# Patient Record
Sex: Female | Born: 1970 | Race: Black or African American | Hispanic: No | Marital: Married | Smoking: Never smoker
Health system: Southern US, Community
[De-identification: ages and names within clinical notes are randomized; demographics above are authoritative.]

## PROBLEM LIST (undated history)

## (undated) ENCOUNTER — Inpatient Hospital Stay (HOSPITAL_COMMUNITY): Payer: Self-pay

## (undated) DIAGNOSIS — D249 Benign neoplasm of unspecified breast: Secondary | ICD-10-CM

## (undated) DIAGNOSIS — F419 Anxiety disorder, unspecified: Secondary | ICD-10-CM

## (undated) DIAGNOSIS — Z789 Other specified health status: Secondary | ICD-10-CM

## (undated) DIAGNOSIS — F329 Major depressive disorder, single episode, unspecified: Secondary | ICD-10-CM

## (undated) DIAGNOSIS — Z8619 Personal history of other infectious and parasitic diseases: Secondary | ICD-10-CM

## (undated) DIAGNOSIS — D219 Benign neoplasm of connective and other soft tissue, unspecified: Secondary | ICD-10-CM

## (undated) DIAGNOSIS — N390 Urinary tract infection, site not specified: Secondary | ICD-10-CM

## (undated) DIAGNOSIS — F32A Depression, unspecified: Secondary | ICD-10-CM

## (undated) DIAGNOSIS — K219 Gastro-esophageal reflux disease without esophagitis: Secondary | ICD-10-CM

## (undated) DIAGNOSIS — O09529 Supervision of elderly multigravida, unspecified trimester: Secondary | ICD-10-CM

## (undated) DIAGNOSIS — N6011 Diffuse cystic mastopathy of right breast: Secondary | ICD-10-CM

## (undated) HISTORY — PX: COLONOSCOPY: SHX174

## (undated) HISTORY — DX: Supervision of elderly multigravida, unspecified trimester: O09.529

## (undated) HISTORY — DX: Personal history of other infectious and parasitic diseases: Z86.19

## (undated) HISTORY — DX: Benign neoplasm of connective and other soft tissue, unspecified: D21.9

---

## 1998-04-13 ENCOUNTER — Other Ambulatory Visit: Admission: RE | Admit: 1998-04-13 | Discharge: 1998-04-13 | Payer: Self-pay | Admitting: Obstetrics & Gynecology

## 1999-11-30 ENCOUNTER — Other Ambulatory Visit: Admission: RE | Admit: 1999-11-30 | Discharge: 1999-11-30 | Payer: Self-pay | Admitting: Obstetrics & Gynecology

## 2003-09-02 ENCOUNTER — Other Ambulatory Visit: Admission: RE | Admit: 2003-09-02 | Discharge: 2003-09-02 | Payer: Self-pay | Admitting: Obstetrics & Gynecology

## 2005-03-14 ENCOUNTER — Other Ambulatory Visit: Admission: RE | Admit: 2005-03-14 | Discharge: 2005-03-14 | Payer: Self-pay | Admitting: Obstetrics & Gynecology

## 2009-05-16 ENCOUNTER — Encounter: Admission: RE | Admit: 2009-05-16 | Discharge: 2009-05-16 | Payer: Self-pay | Admitting: Family Medicine

## 2010-09-18 ENCOUNTER — Encounter: Payer: Self-pay | Admitting: Family Medicine

## 2011-03-21 ENCOUNTER — Other Ambulatory Visit (HOSPITAL_COMMUNITY): Payer: Self-pay | Admitting: Obstetrics & Gynecology

## 2011-03-21 DIAGNOSIS — N979 Female infertility, unspecified: Secondary | ICD-10-CM

## 2011-03-23 ENCOUNTER — Ambulatory Visit (HOSPITAL_COMMUNITY)
Admission: RE | Admit: 2011-03-23 | Discharge: 2011-03-23 | Disposition: A | Discharge status: Home / Self Care | Payer: BC Managed Care – PPO | Source: Ambulatory Visit | Attending: Obstetrics & Gynecology | Admitting: Obstetrics & Gynecology

## 2011-03-23 DIAGNOSIS — N979 Female infertility, unspecified: Secondary | ICD-10-CM | POA: Insufficient documentation

## 2011-03-23 MED ORDER — IOHEXOL 300 MG/ML  SOLN: 30.0000 mL | Freq: Once | INTRAMUSCULAR | Status: AC | PRN

## 2011-10-16 ENCOUNTER — Encounter (HOSPITAL_COMMUNITY): Payer: Self-pay | Admitting: Emergency Medicine

## 2011-10-16 ENCOUNTER — Emergency Department (HOSPITAL_COMMUNITY): Payer: BC Managed Care – PPO

## 2011-10-16 ENCOUNTER — Emergency Department (HOSPITAL_COMMUNITY)
Admission: EM | Admit: 2011-10-16 | Discharge: 2011-10-16 | Disposition: A | Discharge status: Home / Self Care | Payer: BC Managed Care – PPO | Attending: Emergency Medicine | Admitting: Emergency Medicine

## 2011-10-16 DIAGNOSIS — M25529 Pain in unspecified elbow: Secondary | ICD-10-CM | POA: Insufficient documentation

## 2011-10-16 DIAGNOSIS — M542 Cervicalgia: Secondary | ICD-10-CM | POA: Insufficient documentation

## 2011-10-16 DIAGNOSIS — M12869 Other specific arthropathies, not elsewhere classified, unspecified knee: Secondary | ICD-10-CM | POA: Insufficient documentation

## 2011-10-16 DIAGNOSIS — M545 Low back pain, unspecified: Secondary | ICD-10-CM | POA: Insufficient documentation

## 2011-10-16 DIAGNOSIS — Z79899 Other long term (current) drug therapy: Secondary | ICD-10-CM | POA: Insufficient documentation

## 2011-10-16 DIAGNOSIS — M25519 Pain in unspecified shoulder: Secondary | ICD-10-CM | POA: Insufficient documentation

## 2011-10-16 DIAGNOSIS — M7918 Myalgia, other site: Secondary | ICD-10-CM

## 2011-10-16 MED ORDER — HYDROCODONE-ACETAMINOPHEN 5-325 MG PO TABS
2.0000 | ORAL_TABLET | Freq: Once | ORAL | Status: AC
  Administered 2011-10-16: 2 via ORAL
  Filled 2011-10-16: qty 2

## 2011-10-16 MED ORDER — IBUPROFEN 800 MG PO TABS
800.0000 mg | ORAL_TABLET | Freq: Once | ORAL | Status: AC
  Administered 2011-10-16: 800 mg via ORAL
  Filled 2011-10-16: qty 1

## 2011-10-16 MED ORDER — IBUPROFEN 800 MG PO TABS: 800.0000 mg | ORAL_TABLET | Freq: Three times a day (TID) | ORAL | Status: AC | PRN

## 2011-10-16 MED ORDER — HYDROCODONE-ACETAMINOPHEN 5-325 MG PO TABS: 2.0000 | ORAL_TABLET | Freq: Four times a day (QID) | ORAL | Status: AC | PRN
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
## 2011-10-16 NOTE — ED Notes (Signed)
Patient transported to CT 

## 2011-10-16 NOTE — ED Notes (Signed)
Patient transported to X-ray 

## 2011-10-16 NOTE — ED Notes (Signed)
Family at bedside. 

## 2011-10-16 NOTE — ED Notes (Signed)
Vital signs stable. 

## 2011-10-16 NOTE — ED Notes (Signed)
NWG:[id number]<ZH> Expected date:10/16/11<BR> Expected time: 8:07 AM<BR> Means of arrival:Ambulance<BR> Comments:<BR> 41yo. weakness

## 2011-10-16 NOTE — ED Notes (Signed)
MD at bedside. 

## 2011-10-16 NOTE — ED Notes (Signed)
Patient is resting comfortably. 
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
## 2012-05-06 LAB — OB RESULTS CONSOLE ANTIBODY SCREEN: Antibody Screen: NEGATIVE

## 2012-05-06 LAB — OB RESULTS CONSOLE ABO/RH: RH Type: POSITIVE

## 2012-08-15 ENCOUNTER — Inpatient Hospital Stay (HOSPITAL_COMMUNITY)
Admission: AD | Admit: 2012-08-15 | Discharge: 2012-08-15 | Disposition: A | Discharge status: Home / Self Care | Payer: BC Managed Care – PPO | Source: Ambulatory Visit | Attending: Obstetrics and Gynecology | Admitting: Obstetrics and Gynecology

## 2012-08-15 ENCOUNTER — Encounter (HOSPITAL_COMMUNITY): Payer: Self-pay | Admitting: *Deleted

## 2012-08-15 ENCOUNTER — Encounter (HOSPITAL_COMMUNITY): Payer: Self-pay | Admitting: Family

## 2012-08-15 DIAGNOSIS — K5900 Constipation, unspecified: Secondary | ICD-10-CM | POA: Insufficient documentation

## 2012-08-15 DIAGNOSIS — O4700 False labor before 37 completed weeks of gestation, unspecified trimester: Secondary | ICD-10-CM

## 2012-08-15 DIAGNOSIS — R109 Unspecified abdominal pain: Secondary | ICD-10-CM | POA: Insufficient documentation

## 2012-08-15 DIAGNOSIS — O479 False labor, unspecified: Secondary | ICD-10-CM

## 2012-08-15 HISTORY — DX: Other specified health status: Z78.9

## 2012-08-15 HISTORY — DX: Urinary tract infection, site not specified: N39.0

## 2012-08-15 LAB — URINE MICROSCOPIC-ADD ON

## 2012-08-15 LAB — URINALYSIS, ROUTINE W REFLEX MICROSCOPIC
Bilirubin Urine: NEGATIVE
Glucose, UA: NEGATIVE mg/dL
Glucose, UA: NEGATIVE mg/dL
Hgb urine dipstick: NEGATIVE
Leukocytes, UA: NEGATIVE
Protein, ur: NEGATIVE mg/dL
Specific Gravity, Urine: >1.03 — ABNORMAL HIGH (ref 1.005–1.030)
Urobilinogen, UA: 0.2 mg/dL (ref 0.0–1.0)
Urobilinogen, UA: 0.2 mg/dL (ref 0.0–1.0)
pH: 6 (ref 5.0–8.0)

## 2012-08-15 LAB — FETAL FIBRONECTIN: Fetal Fibronectin: NEGATIVE

## 2012-08-15 LAB — WET PREP, GENITAL: Trich, Wet Prep: NONE SEEN

## 2012-08-15 LAB — GROUP B STREP BY PCR: Group B strep by PCR: NEGATIVE

## 2012-08-15 LAB — GC/CHLAMYDIA PROBE AMP: CT Probe RNA: NEGATIVE

## 2012-08-15 MED ORDER — LACTATED RINGERS IV SOLN
Freq: Once | INTRAVENOUS | Status: AC
  Administered 2012-08-15: 03:00:00 via INTRAVENOUS

## 2012-08-15 MED ORDER — TERBUTALINE SULFATE 1 MG/ML IJ SOLN
0.2500 mg | Freq: Once | INTRAMUSCULAR | Status: AC
  Administered 2012-08-15: 0.25 mg via SUBCUTANEOUS
  Filled 2012-08-15: qty 1

## 2012-08-15 MED ORDER — NIFEDIPINE 10 MG PO CAPS: 10.0000 mg | ORAL_CAPSULE | Freq: Four times a day (QID) | ORAL | Status: DC

## 2012-08-15 MED ORDER — NIFEDIPINE 10 MG PO CAPS
10.0000 mg | ORAL_CAPSULE | Freq: Once | ORAL | Status: AC
  Administered 2012-08-15: 10 mg via ORAL
  Filled 2012-08-15: qty 1

## 2012-08-15 NOTE — MAU Note (Signed)
Patient was seen here last night for contractions; given Procardia x 2 and sent home; reports contractions got worse when she got home last night and has not been able to sleep.  Feels she was contracting every 5 minutes today and called office; was told to come in for eval.

## 2012-08-15 NOTE — MAU Provider Note (Signed)
History     CSN: [id number]  Arrival date and time: 08/15/12 0120   First Provider Initiated Contact with Patient 08/15/12 [phone]      Chief Complaint  Patient presents with  . Contractions   HPI Ashley Gibson 41 y.o. 39 w 1 d with [facility] of March 20.  Comes to MAU with contractions.  Was seen in the office on Thursday and thought the abdominal pains were from constipation.  Pain has worsened since then and began being more intense since 6pm.    OB History    Grav Para Term Preterm Abortions TAB SAB Ect Mult Living   2 1 1  0 0 0 0 0 0 1      Past Medical History  Diagnosis Date  . No pertinent past medical history     Past Surgical History  Procedure Date  . No past surgeries     Family History  Problem Relation Age of Onset  . Hypertension Mother     History  Substance Use Topics  . Smoking status: Not on file  . Smokeless tobacco: Not on file  . Alcohol Use: No    Allergies: No Known Allergies  Prescriptions prior to admission  Medication Sig Dispense Refill  . Aspirin-Acetaminophen-Caffeine 260-130-16 MG TABS Take 1 packet by mouth every 6 (six) hours as needed. pain      . ibuprofen (ADVIL,MOTRIN) 200 MG tablet Take 400 mg by mouth every 6 (six) hours as needed. pain      . OVER THE COUNTER MEDICATION Take 1 capsule by mouth daily. Slim trim dietary supplement        Review of Systems  Constitutional: Negative for fever.  Gastrointestinal: Positive for abdominal pain. Negative for nausea, vomiting and diarrhea.  Genitourinary: Positive for frequency.       No vaginal discharge. No vaginal bleeding. No dysuria.   Physical Exam   Blood pressure 141/88, pulse 90, temperature 98.8 F (37.1 C), temperature source Oral, resp. rate 18.  Physical Exam  Nursing note and vitals reviewed. Constitutional: She is oriented to person, place, and time. She appears well-developed and well-nourished.  HENT:  Head: Normocephalic.  Eyes: EOM are normal.  Neck:  Neck supple.  GI: Soft. There is no tenderness. There is no rebound and no guarding.       Contractions 2-4 minutes on arrival to MAU.  Uncomfortable with contractions. FHT baseline 145  Genitourinary:       Speculum exam: Vagina - some erythema, Small amount of creamy discharge, no odor Cervix - No contact bleeding Bimanual exam: Cervix posterior, closed, thick, soft, presenting part high GC/Chlam, wet prep done Chaperone present for exam.  Musculoskeletal: Normal range of motion.  Neurological: She is alert and oriented to person, place, and time.  Skin: Skin is warm and dry.  Psychiatric: She has a normal mood and affect.    MAU Course  Procedures Results for orders placed during the hospital encounter of 08/15/12 (from the past 24 hour(s))  URINALYSIS, ROUTINE W REFLEX MICROSCOPIC     Status: Abnormal   Collection Time   08/15/12  1:30 AM      Component Value Range   Color, Urine YELLOW  YELLOW   APPearance CLEAR  CLEAR   Specific Gravity, Urine >1.030 (*) 1.005 - 1.030   pH 6.0  5.0 - 8.0   Glucose, UA NEGATIVE  NEGATIVE mg/dL   Hgb urine dipstick NEGATIVE  NEGATIVE   Bilirubin Urine NEGATIVE  NEGATIVE  Ketones, ur >80 (*) NEGATIVE mg/dL   Protein, ur NEGATIVE  NEGATIVE mg/dL   Urobilinogen, UA 0.2  0.0 - 1.0 mg/dL   Nitrite NEGATIVE  NEGATIVE   Leukocytes, UA TRACE (*) NEGATIVE  URINE MICROSCOPIC-ADD ON     Status: Abnormal   Collection Time   08/15/12  1:30 AM      Component Value Range   Squamous Epithelial / LPF FEW (*) RARE   WBC, UA 0-2  <3 WBC/hpf   RBC / HPF 0-2  <3 RBC/hpf   Bacteria, UA RARE  RARE   Urine-Other MUCOUS PRESENT    WET PREP, GENITAL     Status: Abnormal   Collection Time   08/15/12  2:30 AM      Component Value Range   Yeast Wet Prep HPF POC NONE SEEN  NONE SEEN   Trich, Wet Prep NONE SEEN  NONE SEEN   Clue Cells Wet Prep HPF POC NONE SEEN  NONE SEEN   WBC, Wet Prep HPF POC FEW (*) NONE SEEN  FETAL FIBRONECTIN     Status: Normal    Collection Time   08/15/12  2:30 AM      Component Value Range   Fetal Fibronectin NEGATIVE  NEGATIVE   MDM 0245  Consult with Dr. Henderson Cloud re: plan of care.  Will give Procardia PO and fluids.  Assessment and Plan  Preterm contractions  Plan Procardia 10 mg PO 2 doses given to decrease contractions. 1000 cc LR infused for hydration. Contractions have decreased dramatically since arrival with fluids and procardia. Will discharge client.  To call the office today if contractions worsen after going home. Drink at least 8 8-oz glasses of water every day.   Ashley Gibson 08/15/2012, 2:28 AM

## 2012-08-15 NOTE — MAU Note (Signed)
Pt states she was seen in the office at 0845. Pt states she informed provider that she was having pains at that time. Pt states pains started getting worse at about 1800.

## 2012-08-15 NOTE — Progress Notes (Signed)
Geannie Risen NP informed of pt's presence on the unit and contraction pattern

## 2012-08-15 NOTE — Progress Notes (Signed)
Pt uncomfortable during pelvic exam.

## 2012-08-15 NOTE — MAU Provider Note (Signed)
History     CSN: [id number]  Arrival date and time: 08/15/12 1351   None     Chief Complaint  Patient presents with  . Laboring   HPI 41 y.o. G2P1001 at [redacted]w[redacted]d with contractions. Seen in MAU last night. Had two doses procardia in MAU with decrease in contractions. Started again 6:30 AM, every 5 minutes. No LOF or vaginal bleeding. Not able to sleep. Increased urinary frequency.  Baby moving normally.    OB History    Grav Para Term Preterm Abortions TAB SAB Ect Mult Living   2 1 1  0 0 0 0 0 0 1      Past Medical History  Diagnosis Date  . No pertinent past medical history   . UTI (lower urinary tract infection)     Past Surgical History  Procedure Date  . No past surgeries     Family History  Problem Relation Age of Onset  . Hypertension Mother     History  Substance Use Topics  . Smoking status: Never Smoker   . Smokeless tobacco: Not on file  . Alcohol Use: No    Allergies: No Known Allergies  Prescriptions prior to admission  Medication Sig Dispense Refill  . acetaminophen (TYLENOL) 500 MG tablet Take 1,000 mg by mouth daily as needed. pain      . calcium carbonate (TUMS - DOSED IN MG ELEMENTAL CALCIUM) 500 MG chewable tablet Chew 1 tablet by mouth as needed. indigestion      . Multiple Vitamin (MULTIVITAMIN WITH MINERALS) TABS Take 1 tablet by mouth daily.      . ondansetron (ZOFRAN-ODT) 8 MG disintegrating tablet Take 8 mg by mouth every 12 (twelve) hours as needed.        Review of Systems  Constitutional: Negative for fever and chills.  Eyes: Negative for blurred vision and double vision.  Gastrointestinal: Positive for constipation. Negative for nausea, vomiting and diarrhea.  Genitourinary: Positive for frequency. Negative for dysuria.  Neurological: Positive for headaches.   Physical Exam   Blood pressure 128/71, pulse 98, temperature 97.9 F (36.6 C), temperature source Oral, resp. rate 20, height 5\' 3"  (1.6 m), weight 104.441 kg (230 lb 4  oz), last menstrual period 09/28/2011.  Physical Exam  Constitutional: She is oriented to person, place, and time. She appears well-developed and well-nourished. No distress.  HENT:  Head: Normocephalic and atraumatic.  Eyes: Conjunctivae normal and EOM are normal.  Neck: Normal range of motion. Neck supple.  Cardiovascular: Normal rate.   Respiratory: Effort normal. No respiratory distress.  GI: Soft. Bowel sounds are normal. There is tenderness (right side).  Neurological: She is alert and oriented to person, place, and time.  Skin: Skin is warm and dry.  Psychiatric: She has a normal mood and affect.   FHTs:  135-140, moderate variability, accels present, occasional short variable. TOCO: Poor tracing, not picking up contractions when patient is breathing through them. Only 2 ctx in 1 hour registered after terbutaline.   MAU Course  Procedures  0.25 mg Terbutaline given SQ. Pt states ctx are still there but spaced out to q 15 minutes.    Assessment and Plan  [redacted]w[redacted]d G2P1001 at [redacted]w[redacted]d with preterm contractions. - No cervical change - Improved with terbutaline (and procardia last night) - Hx preterm ctx with subsequent term delivery - Discussed with Dr. Dareen Piano:  Home on procardia 10 mg q 6 hours and f/u as scheduled - Labor precautions discussed.   Napoleon Form 08/15/2012, 3:44 PM

## 2012-08-15 NOTE — MAU Note (Signed)
Pt states was seen here last pm, given iv for dehydration, and given procardia. Contractions still coming. Denies bleeding or lof.

## 2012-08-27 NOTE — L&D Delivery Note (Signed)
Delivery Note At 4:23 PM a viable and healthy female was delivered via Vaginal, Spontaneous Delivery (Presentation: Left; Occiput Anterior).  APGAR: 8, 9; weight pending.   Placenta status: spontaneous, intact.  Cord: 3 vessels with a loose nuchal x 1  Anesthesia: Epidural and 1% lidocaine Episiotomy: None Lacerations: 2nd degree perineal Suture Repair: 3.0 vicryl rapide Est. Blood Loss (mL):  250 cc  Mom to postpartum.  Baby to nursery-stable.  Summar Mcglothlin H. 11/08/2012, 4:49 PM

## 2012-10-02 LAB — OB RESULTS CONSOLE GBS: GBS: NEGATIVE

## 2012-10-24 ENCOUNTER — Telehealth (HOSPITAL_COMMUNITY): Payer: Self-pay | Admitting: *Deleted

## 2012-10-24 ENCOUNTER — Encounter (HOSPITAL_COMMUNITY): Payer: Self-pay | Admitting: *Deleted

## 2012-10-24 NOTE — Telephone Encounter (Signed)
Preadmission screen  

## 2012-11-02 ENCOUNTER — Inpatient Hospital Stay (HOSPITAL_COMMUNITY)
Admission: AD | Admit: 2012-11-02 | Discharge: 2012-11-03 | Disposition: A | Discharge status: Home / Self Care | Payer: BC Managed Care – PPO | Source: Ambulatory Visit | Attending: Obstetrics and Gynecology | Admitting: Obstetrics and Gynecology

## 2012-11-02 ENCOUNTER — Encounter (HOSPITAL_COMMUNITY): Payer: Self-pay | Admitting: Family

## 2012-11-02 ENCOUNTER — Inpatient Hospital Stay (HOSPITAL_COMMUNITY): Payer: BC Managed Care – PPO

## 2012-11-02 DIAGNOSIS — O368190 Decreased fetal movements, unspecified trimester, not applicable or unspecified: Secondary | ICD-10-CM | POA: Insufficient documentation

## 2012-11-02 LAB — CBC WITH DIFFERENTIAL/PLATELET
Basophils Absolute: 0 10*3/uL (ref 0.0–0.1)
Eosinophils Absolute: 0.1 10*3/uL (ref 0.0–0.7)
Lymphocytes Relative: 25 % (ref 12–46)
Lymphs Abs: 2.1 10*3/uL (ref 0.7–4.0)
MCH: 25.6 pg — ABNORMAL LOW (ref 26.0–34.0)
Neutrophils Relative %: 66 % (ref 43–77)
Platelets: 234 10*3/uL (ref 150–400)
RBC: 3.83 MIL/uL — ABNORMAL LOW (ref 3.87–5.11)
RDW: 15.3 % (ref 11.5–15.5)
WBC: 8.6 10*3/uL (ref 4.0–10.5)

## 2012-11-02 LAB — COMPREHENSIVE METABOLIC PANEL
ALT: 9 U/L (ref 0–35)
AST: 14 U/L (ref 0–37)
Alkaline Phosphatase: 122 U/L — ABNORMAL HIGH (ref 39–117)
GFR calc Af Amer: 90 mL/min — ABNORMAL LOW (ref >90)
Glucose, Bld: 83 mg/dL (ref 70–99)
Potassium: 4.2 mEq/L (ref 3.5–5.1)
Sodium: 136 mEq/L (ref 135–145)
Total Protein: 5.9 g/dL — ABNORMAL LOW (ref 6.0–8.3)

## 2012-11-02 LAB — URINE MICROSCOPIC-ADD ON

## 2012-11-02 LAB — URINALYSIS, ROUTINE W REFLEX MICROSCOPIC
Protein, ur: NEGATIVE mg/dL
Urobilinogen, UA: 0.2 mg/dL (ref 0.0–1.0)

## 2012-11-02 NOTE — MAU Note (Signed)
Pt reports baby is not moving as much as normal. States she has increased fluid and they told her to monitor fetal movement.

## 2012-11-02 NOTE — MAU Provider Note (Signed)
History     CSN: [id number]  Arrival date and time: 11/02/12 2123   First Provider Initiated Contact with Patient 11/02/12 2200      Chief Complaint  Patient presents with  . Decreased Fetal Movement   HPI Ashley Gibson is a 42 y.o. female who presents to MAU with decreased fetal movement. She states that she has felt much less movement all day than usual so decided to come in tonight. She was in the office 5 days ago and told that if she has any decrease in fetal movement to come to MAU for monitoring. The patient states that she has to much fluid around the baby. She denies bleeding or leaking of fluid. The history was provided by the patient.   OB History   Grav Para Term Preterm Abortions TAB SAB Ect Mult Living   2 1 1  0 0 0 0 0 0 1      Past Medical History  Diagnosis Date  . No pertinent past medical history   . UTI (lower urinary tract infection)   . Hx of chlamydia infection   . Hx of varicella   . AMA (advanced maternal age) multigravida 35+   . Fibroid     Past Surgical History  Procedure Laterality Date  . No past surgeries      Family History  Problem Relation Age of Onset  . Hypertension Mother   . Asthma Son     History  Substance Use Topics  . Smoking status: Never Smoker   . Smokeless tobacco: Not on file  . Alcohol Use: No    Allergies: No Known Allergies  No prescriptions prior to admission    Review of Systems  Constitutional: Negative for fever and chills.  HENT: Negative for neck pain.   Eyes: Negative for blurred vision.  Respiratory: Negative for cough.   Cardiovascular: Negative for chest pain.  Gastrointestinal: Negative for vomiting. Abdominal pain: occasional contraction.  Genitourinary: Negative for frequency.       Decreased fetal movement. No vaginal bleeding or leaking of fluid.  Musculoskeletal: Positive for back pain.  Neurological: Negative for dizziness and headaches.  Psychiatric/Behavioral: Negative for  depression.   Blood pressure 133/67, pulse 79, temperature 98.9 F (37.2 C), temperature source Oral, resp. rate 20, height 5\' 3"  (1.6 m), weight 238 lb (107.956 kg), last menstrual period 09/28/2011, SpO2 100.00%.  Physical Exam  Nursing note and vitals reviewed. Constitutional: She is oriented to person, place, and time. She appears well-developed and well-nourished. No distress.  Blood pressure 153/74  HENT:  Head: Normocephalic and atraumatic.  Eyes: EOM are normal.  Neck: Neck supple.  Cardiovascular: Normal rate.   Respiratory: Effort normal.  GI: Soft. There is no tenderness.  Musculoskeletal: Normal range of motion.  Neurological: She is alert and oriented to person, place, and time.  Skin: Skin is warm and dry.  Psychiatric: She has a normal mood and affect. Her behavior is normal. Judgment and thought content normal.   Procedures  Results for orders placed during the hospital encounter of 11/02/12 (from the past 24 hour(s))  URINALYSIS, ROUTINE W REFLEX MICROSCOPIC     Status: Abnormal   Collection Time    11/02/12  9:35 PM      Result Value Range   Color, Urine YELLOW  YELLOW   APPearance CLEAR  CLEAR   Specific Gravity, Urine 1.025  1.005 - 1.030   pH 6.0  5.0 - 8.0   Glucose, UA NEGATIVE  NEGATIVE mg/dL   Hgb urine dipstick LARGE (*) NEGATIVE   Bilirubin Urine NEGATIVE  NEGATIVE   Ketones, ur NEGATIVE  NEGATIVE mg/dL   Protein, ur NEGATIVE  NEGATIVE mg/dL   Urobilinogen, UA 0.2  0.0 - 1.0 mg/dL   Nitrite NEGATIVE  NEGATIVE   Leukocytes, UA SMALL (*) NEGATIVE  URINE MICROSCOPIC-ADD ON     Status: Abnormal   Collection Time    11/02/12  9:35 PM      Result Value Range   Squamous Epithelial / LPF MANY (*) RARE   WBC, UA 7-10  <3 WBC/hpf   RBC / HPF 11-20  <3 RBC/hpf   Bacteria, UA MANY (*) RARE  CBC WITH DIFFERENTIAL     Status: Abnormal   Collection Time    11/02/12 11:25 PM      Result Value Range   WBC 8.6  4.0 - 10.5 K/uL   RBC 3.83 (*) 3.87 - 5.11  MIL/uL   Hemoglobin 9.8 (*) 12.0 - 15.0 g/dL   HCT 16.1 (*) 09.6 - 04.5 %   MCV 80.2  78.0 - 100.0 fL   MCH 25.6 (*) 26.0 - 34.0 pg   MCHC 31.9  30.0 - 36.0 g/dL   RDW 40.9  81.1 - 91.4 %   Platelets 234  150 - 400 K/uL   Neutrophils Relative 66  43 - 77 %   Neutro Abs 5.7  1.7 - 7.7 K/uL   Lymphocytes Relative 25  12 - 46 %   Lymphs Abs 2.1  0.7 - 4.0 K/uL   Monocytes Relative 8  3 - 12 %   Monocytes Absolute 0.7  0.1 - 1.0 K/uL   Eosinophils Relative 1  0 - 5 %   Eosinophils Absolute 0.1  0.0 - 0.7 K/uL   Basophils Relative 0  0 - 1 %   Basophils Absolute 0.0  0.0 - 0.1 K/uL  COMPREHENSIVE METABOLIC PANEL     Status: Abnormal   Collection Time    11/02/12 11:25 PM      Result Value Range   Sodium 136  135 - 145 mEq/L   Potassium 4.2  3.5 - 5.1 mEq/L   Chloride 105  96 - 112 mEq/L   CO2 23  19 - 32 mEq/L   Glucose, Bld 83  70 - 99 mg/dL   BUN 12  6 - 23 mg/dL   Creatinine, Ser 7.82  0.50 - 1.10 mg/dL   Calcium 9.5  8.4 - 95.6 mg/dL   Total Protein 5.9 (*) 6.0 - 8.3 g/dL   Albumin 2.5 (*) 3.5 - 5.2 g/dL   AST 14  0 - 37 U/L   ALT 9  0 - 35 U/L   Alkaline Phosphatase 122 (*) 39 - 117 U/L   Total Bilirubin 0.3  0.3 - 1.2 mg/dL   GFR calc non Af Amer 77 (*) >90 mL/min   GFR calc Af Amer 90 (*) >90 mL/min    EFM: Baseline 130, reactive,   US Ob Limited  11/03/2012  *RADIOLOGY REPORT*  Clinical Data:  Pregnant, decreased fetal movement  LIMITED OBSTETRIC ULTRASOUND  BIOPHYSICAL PROFILE  Number of Fetuses: 1 Heart Rate:  146bpm Movement:  Present Presentation:  Cephalic Placental Location:  Anterior fundal Previa:  No Amniotic Fluid (subjective):  Increased  Vertical pocket:  11.1cm      AFI:  27.9cm (5%ile 7.3 cm, 95%ile 23.9 cm)  BPD:  9.19cm      37w  3d          EDC: Is not 14  MATERNAL FINDINGS: Cervix:  Not evaluated Uterus/Adnexae:  Right ovary not visualized.  Left ovary unremarkable.  Uterine leiomyomata identified, largest left fundal 8.6 x 7.8 x 10.1 cm  BPP:  Movement:  2 Breathing:  2 Tone:  2 Amniotic Fluid:  2  Total Score:  8/8  Impression: Single live intrauterine gestation as above. Subjectively increased amniotic fluid volume with increased AFI of 27.9 cm. Uterine leiomyomata. BPP 8/8.  This exam is performed on an emergent basis and does not comprehensively evaluate fetal size, dating, or anatomy, and a follow-up complete OB US should be considered if further fetal assessment is warranted.   Original Report Authenticated By: Ulyses Southward, M.D.    US Fetal Bpp W/o Non Stress  11/03/2012  *RADIOLOGY REPORT*  Clinical Data:  Pregnant, decreased fetal movement  LIMITED OBSTETRIC ULTRASOUND  BIOPHYSICAL PROFILE  Number of Fetuses: 1 Heart Rate:  146bpm Movement:  Present Presentation:  Cephalic Placental Location:  Anterior fundal Previa:  No Amniotic Fluid (subjective):  Increased  Vertical pocket:  11.1cm      AFI:  27.9cm (5%ile 7.3 cm, 95%ile 23.9 cm)  BPD:  9.19cm      37w         3d          EDC: Is not 14  MATERNAL FINDINGS: Cervix:  Not evaluated Uterus/Adnexae:  Right ovary not visualized.  Left ovary unremarkable.  Uterine leiomyomata identified, largest left fundal 8.6 x 7.8 x 10.1 cm  BPP: Movement:  2 Breathing:  2 Tone:  2 Amniotic Fluid:  2  Total Score:  8/8  Impression: Single live intrauterine gestation as above. Subjectively increased amniotic fluid volume with increased AFI of 27.9 cm. Uterine leiomyomata. BPP 8/8.  This exam is performed on an emergent basis and does not comprehensively evaluate fetal size, dating, or anatomy, and a follow-up complete OB US should be considered if further fetal assessment is warranted.   Original Report Authenticated By: Ulyses Southward, M.D.     23:15 Discussed with Dr. Claiborne Billings Will do serial blood pressures, Ultrasound, CBC, CMET and RN will call her with the results.  Results called to Dr. Claiborne Billings and discharge instructions given.   Assessment: 42 y.o. female @ [redacted]w[redacted]d gestation reporting decreased fetal  movement  Plan:  Increase PO fluids, follow up in the office  NEESE,HOPE, RN, FNP, [facility] 11/03/2012, 5:00 AM

## 2012-11-02 NOTE — MAU Note (Addendum)
Patient presents to MAU with c/o decreased fetal movement today; reports she was told to come in if fetal movement decreased. Patient reports she was told Tuesday she has increased amniotic fluid.

## 2012-11-04 LAB — URINE CULTURE
Colony Count: NO GROWTH
Culture: NO GROWTH

## 2012-11-08 ENCOUNTER — Inpatient Hospital Stay (HOSPITAL_COMMUNITY): Payer: BC Managed Care – PPO | Admitting: Anesthesiology

## 2012-11-08 ENCOUNTER — Encounter (HOSPITAL_COMMUNITY): Payer: Self-pay | Admitting: Anesthesiology

## 2012-11-08 ENCOUNTER — Inpatient Hospital Stay (HOSPITAL_COMMUNITY)
Admission: AD | Admit: 2012-11-08 | Discharge: 2012-11-10 | DRG: 373 | Disposition: A | Discharge status: Home / Self Care | Payer: BC Managed Care – PPO | Source: Ambulatory Visit | Attending: Obstetrics and Gynecology | Admitting: Obstetrics and Gynecology

## 2012-11-08 ENCOUNTER — Encounter (HOSPITAL_COMMUNITY): Payer: Self-pay | Admitting: *Deleted

## 2012-11-08 DIAGNOSIS — O09529 Supervision of elderly multigravida, unspecified trimester: Secondary | ICD-10-CM | POA: Diagnosis present

## 2012-11-08 DIAGNOSIS — O409XX0 Polyhydramnios, unspecified trimester, not applicable or unspecified: Secondary | ICD-10-CM | POA: Diagnosis present

## 2012-11-08 DIAGNOSIS — D259 Leiomyoma of uterus, unspecified: Secondary | ICD-10-CM | POA: Diagnosis present

## 2012-11-08 DIAGNOSIS — D4959 Neoplasm of unspecified behavior of other genitourinary organ: Secondary | ICD-10-CM | POA: Diagnosis present

## 2012-11-08 DIAGNOSIS — O34599 Maternal care for other abnormalities of gravid uterus, unspecified trimester: Secondary | ICD-10-CM | POA: Diagnosis present

## 2012-11-08 LAB — CBC
HCT: 31.1 % — ABNORMAL LOW (ref 36.0–46.0)
HCT: 34 % — ABNORMAL LOW (ref 36.0–46.0)
Hemoglobin: 10 g/dL — ABNORMAL LOW (ref 12.0–15.0)
Hemoglobin: 10.9 g/dL — ABNORMAL LOW (ref 12.0–15.0)
MCH: 25.5 pg — ABNORMAL LOW (ref 26.0–34.0)
MCHC: 32.1 g/dL (ref 30.0–36.0)
MCV: 78.9 fL (ref 78.0–100.0)
MCV: 79.4 fL (ref 78.0–100.0)
Platelets: 223 10*3/uL (ref 150–400)
RBC: 3.94 MIL/uL (ref 3.87–5.11)
RBC: 4.28 MIL/uL (ref 3.87–5.11)
RDW: 15.6 % — ABNORMAL HIGH (ref 11.5–15.5)
WBC: 8 10*3/uL (ref 4.0–10.5)
WBC: 15 10*3/uL — ABNORMAL HIGH (ref 4.0–10.5)

## 2012-11-08 LAB — COMPREHENSIVE METABOLIC PANEL
ALT: 8 U/L (ref 0–35)
Alkaline Phosphatase: 131 U/L — ABNORMAL HIGH (ref 39–117)
CO2: 20 mEq/L (ref 19–32)
Chloride: 105 mEq/L (ref 96–112)
GFR calc Af Amer: >90 mL/min (ref >90)
Glucose, Bld: 82 mg/dL (ref 70–99)
Potassium: 3.7 mEq/L (ref 3.5–5.1)
Sodium: 136 mEq/L (ref 135–145)
Total Bilirubin: 0.3 mg/dL (ref 0.3–1.2)
Total Protein: 6.3 g/dL (ref 6.0–8.3)

## 2012-11-08 LAB — ABO/RH: ABO/RH(D): B POS

## 2012-11-08 MED ORDER — BENZOCAINE-MENTHOL 20-0.5 % EX AERO
1.0000 "application " | INHALATION_SPRAY | CUTANEOUS | Status: DC | PRN
  Administered 2012-11-08: 1 via TOPICAL
  Filled 2012-11-08 (×2): qty 56

## 2012-11-08 MED ORDER — PHENYLEPHRINE 40 MCG/ML (10ML) SYRINGE FOR IV PUSH (FOR BLOOD PRESSURE SUPPORT): 80.0000 ug | PREFILLED_SYRINGE | INTRAVENOUS | Status: DC | PRN

## 2012-11-08 MED ORDER — DIBUCAINE 1 % RE OINT: 1.0000 "application " | TOPICAL_OINTMENT | RECTAL | Status: DC | PRN

## 2012-11-08 MED ORDER — SENNOSIDES-DOCUSATE SODIUM 8.6-50 MG PO TABS
2.0000 | ORAL_TABLET | Freq: Every day | ORAL | Status: DC
  Administered 2012-11-08 – 2012-11-09 (×2): 2 via ORAL

## 2012-11-08 MED ORDER — FENTANYL 2.5 MCG/ML BUPIVACAINE 1/10 % EPIDURAL INFUSION (WH - ANES)
14.0000 mL/h | INTRAMUSCULAR | Status: DC | PRN
  Administered 2012-11-08: 14 mL/h via EPIDURAL
  Filled 2012-11-08: qty 125

## 2012-11-08 MED ORDER — OXYTOCIN BOLUS FROM INFUSION: 500.0000 mL | INTRAVENOUS | Status: DC

## 2012-11-08 MED ORDER — OXYTOCIN 40 UNITS IN LACTATED RINGERS INFUSION - SIMPLE MED
1.0000 m[IU]/min | INTRAVENOUS | Status: DC
  Administered 2012-11-08: 2 m[IU]/min via INTRAVENOUS

## 2012-11-08 MED ORDER — DIPHENHYDRAMINE HCL 25 MG PO CAPS: 25.0000 mg | ORAL_CAPSULE | Freq: Four times a day (QID) | ORAL | Status: DC | PRN

## 2012-11-08 MED ORDER — FLEET ENEMA 7-19 GM/118ML RE ENEM: 1.0000 | ENEMA | RECTAL | Status: DC | PRN

## 2012-11-08 MED ORDER — ZOLPIDEM TARTRATE 5 MG PO TABS: 5.0000 mg | ORAL_TABLET | Freq: Every evening | ORAL | Status: DC | PRN

## 2012-11-08 MED ORDER — IBUPROFEN 600 MG PO TABS
600.0000 mg | ORAL_TABLET | Freq: Four times a day (QID) | ORAL | Status: DC
  Administered 2012-11-08 – 2012-11-10 (×7): 600 mg via ORAL
  Filled 2012-11-08 (×8): qty 1

## 2012-11-08 MED ORDER — ACETAMINOPHEN 325 MG PO TABS: 650.0000 mg | ORAL_TABLET | ORAL | Status: DC | PRN

## 2012-11-08 MED ORDER — TETANUS-DIPHTH-ACELL PERTUSSIS 5-2.5-18.5 LF-MCG/0.5 IM SUSP
0.5000 mL | Freq: Once | INTRAMUSCULAR | Status: AC
  Administered 2012-11-09: 0.5 mL via INTRAMUSCULAR
  Filled 2012-11-08: qty 0.5

## 2012-11-08 MED ORDER — BUTORPHANOL TARTRATE 1 MG/ML IJ SOLN
1.0000 mg | INTRAMUSCULAR | Status: DC | PRN
  Administered 2012-11-08: 1 mg via INTRAVENOUS
  Filled 2012-11-08: qty 1

## 2012-11-08 MED ORDER — WITCH HAZEL-GLYCERIN EX PADS: 1.0000 "application " | MEDICATED_PAD | CUTANEOUS | Status: DC | PRN

## 2012-11-08 MED ORDER — OXYCODONE-ACETAMINOPHEN 5-325 MG PO TABS: 1.0000 | ORAL_TABLET | ORAL | Status: DC | PRN

## 2012-11-08 MED ORDER — PRENATAL MULTIVITAMIN CH
1.0000 | ORAL_TABLET | Freq: Every day | ORAL | Status: DC
  Filled 2012-11-08: qty 1

## 2012-11-08 MED ORDER — PHENYLEPHRINE 40 MCG/ML (10ML) SYRINGE FOR IV PUSH (FOR BLOOD PRESSURE SUPPORT)
80.0000 ug | PREFILLED_SYRINGE | INTRAVENOUS | Status: DC | PRN
  Filled 2012-11-08: qty 5

## 2012-11-08 MED ORDER — DIPHENHYDRAMINE HCL 50 MG/ML IJ SOLN: 12.5000 mg | INTRAMUSCULAR | Status: DC | PRN

## 2012-11-08 MED ORDER — EPHEDRINE 5 MG/ML INJ
10.0000 mg | INTRAVENOUS | Status: DC | PRN
  Filled 2012-11-08: qty 4

## 2012-11-08 MED ORDER — LANOLIN HYDROUS EX OINT: TOPICAL_OINTMENT | CUTANEOUS | Status: DC | PRN

## 2012-11-08 MED ORDER — IBUPROFEN 600 MG PO TABS: 600.0000 mg | ORAL_TABLET | Freq: Four times a day (QID) | ORAL | Status: DC | PRN

## 2012-11-08 MED ORDER — SIMETHICONE 80 MG PO CHEW: 80.0000 mg | CHEWABLE_TABLET | ORAL | Status: DC | PRN

## 2012-11-08 MED ORDER — LACTATED RINGERS IV SOLN
500.0000 mL | Freq: Once | INTRAVENOUS | Status: AC
  Administered 2012-11-08: 1000 mL via INTRAVENOUS

## 2012-11-08 MED ORDER — ONDANSETRON HCL 4 MG/2ML IJ SOLN: 4.0000 mg | INTRAMUSCULAR | Status: DC | PRN

## 2012-11-08 MED ORDER — LIDOCAINE HCL (PF) 1 % IJ SOLN
INTRAMUSCULAR | Status: DC | PRN
  Administered 2012-11-08 (×2): 5 mL

## 2012-11-08 MED ORDER — ONDANSETRON HCL 4 MG PO TABS: 4.0000 mg | ORAL_TABLET | ORAL | Status: DC | PRN

## 2012-11-08 MED ORDER — LIDOCAINE HCL (PF) 1 % IJ SOLN
30.0000 mL | INTRAMUSCULAR | Status: AC | PRN
  Administered 2012-11-08: 30 mL via SUBCUTANEOUS
  Filled 2012-11-08 (×2): qty 30

## 2012-11-08 MED ORDER — TERBUTALINE SULFATE 1 MG/ML IJ SOLN: 0.2500 mg | Freq: Once | INTRAMUSCULAR | Status: DC | PRN

## 2012-11-08 MED ORDER — EPHEDRINE 5 MG/ML INJ: 10.0000 mg | INTRAVENOUS | Status: DC | PRN

## 2012-11-08 MED ORDER — LACTATED RINGERS IV SOLN: 500.0000 mL | INTRAVENOUS | Status: DC | PRN

## 2012-11-08 MED ORDER — CITRIC ACID-SODIUM CITRATE 334-500 MG/5ML PO SOLN: 30.0000 mL | ORAL | Status: DC | PRN

## 2012-11-08 MED ORDER — LACTATED RINGERS IV SOLN
INTRAVENOUS | Status: DC
  Administered 2012-11-08 (×2): via INTRAVENOUS

## 2012-11-08 MED ORDER — ONDANSETRON HCL 4 MG/2ML IJ SOLN: 4.0000 mg | Freq: Four times a day (QID) | INTRAMUSCULAR | Status: DC | PRN

## 2012-11-08 MED ORDER — OXYTOCIN 40 UNITS IN LACTATED RINGERS INFUSION - SIMPLE MED
62.5000 mL/h | INTRAVENOUS | Status: DC
  Filled 2012-11-08: qty 1000

## 2012-11-08 NOTE — Anesthesia Preprocedure Evaluation (Signed)
Anesthesia Evaluation  Patient identified by MRN, date of birth, ID band Patient awake    Reviewed: Allergy & Precautions, H&P , Patient's Chart, lab work & pertinent test results  Airway Mallampati: III TM Distance: >3 FB Neck ROM: full    Dental no notable dental hx.    Pulmonary neg pulmonary ROS,  breath sounds clear to auscultation  Pulmonary exam normal       Cardiovascular negative cardio ROS  Rhythm:regular Rate:Normal     Neuro/Psych negative neurological ROS  negative psych ROS   GI/Hepatic negative GI ROS, Neg liver ROS,   Endo/Other  negative endocrine ROSMorbid obesity  Renal/GU negative Renal ROS     Musculoskeletal   Abdominal   Peds  Hematology negative hematology ROS (+)   Anesthesia Other Findings No pertinent past medical history     UTI (lower urinary tract infection)        Hx of chlamydia infection     Hx of varicella        AMA (advanced maternal age) multigravida 35+     Fibroid    Reproductive/Obstetrics (+) Pregnancy                           Anesthesia Physical Anesthesia Plan  ASA: III  Anesthesia Plan: Epidural   Post-op Pain Management:    Induction:   Airway Management Planned:   Additional Equipment:   Intra-op Plan:   Post-operative Plan:   Informed Consent: I have reviewed the patients History and Physical, chart, labs and discussed the procedure including the risks, benefits and alternatives for the proposed anesthesia with the patient or authorized representative who has indicated his/her understanding and acceptance.     Plan Discussed with:   Anesthesia Plan Comments:         Anesthesia Quick Evaluation

## 2012-11-08 NOTE — Anesthesia Procedure Notes (Signed)
Epidural Patient location during procedure: OB Start time: 11/08/2012 12:20 PM  Staffing Anesthesiologist: Angus Seller., Harrell Gave. Performed by: anesthesiologist   Preanesthetic Checklist Completed: patient identified, site marked, surgical consent, pre-op evaluation, timeout performed, IV checked, risks and benefits discussed and monitors and equipment checked  Epidural Patient position: sitting Prep: site prepped and draped and DuraPrep Patient monitoring: continuous pulse ox and blood pressure Approach: midline Injection technique: LOR air and LOR saline  Needle:  Needle type: Tuohy  Needle gauge: 17 G Needle length: 9 cm and 9 Needle insertion depth: 7 cm Catheter type: closed end flexible Catheter size: 19 Gauge Catheter at skin depth: 15 cm Test dose: negative  Assessment Events: blood not aspirated, injection not painful, no injection resistance, negative IV test and no paresthesia  Additional Notes Patient identified.  Risk benefits discussed including failed block, incomplete pain control, headache, nerve damage, paralysis, blood pressure changes, nausea, vomiting, reactions to medication both toxic or allergic, and postpartum back pain.  Patient expressed understanding and wished to proceed.  All questions were answered.  Sterile technique used throughout procedure and epidural site dressed with sterile barrier dressing.  First placement at L2-L3, patient jumped and CSF was noted.  Touhy removed and epidural replaced at level below.  With second placement, No paresthesia or other complications noted.The patient did not experience any signs of intravascular injection such as tinnitus or metallic taste in mouth nor signs of intrathecal spread such as rapid motor block. Please see nursing notes for vital signs.

## 2012-11-08 NOTE — MAU Note (Signed)
Pt reports her water broke at 0723  Yellow/brown fluid noted at this time. reprots mil/modrat ctxs q 7 min. Good fetal movement reported

## 2012-11-08 NOTE — H&P (Signed)
Ashley Gibson is a 42 y.o. female presenting for leaking fluid  42 yo G2P1001 @ 39+ 2 presents c/o leaking fluid and is confirmed to have SROM.  She is 4/80/-2, the same as in the office yesterday.  Her pregnancy has been complicated by AMA and an elevated DSR @ 1 in 121. Pt declined all additional testing.  She has also had mild polyhydramnios for which she's been followed with 2x/wk NSTs. She also has multiple uterine fibroids.  The largest of which was 8.6cm. Pt had been breech at 34 weeks but was confirmed vertex by [place] yesterday. History OB History   Grav Para Term Preterm Abortions TAB SAB Ect Mult Living   2 1 1  0 0 0 0 0 0 1     Past Medical History  Diagnosis Date  . No pertinent past medical history   . UTI (lower urinary tract infection)   . Hx of chlamydia infection   . Hx of varicella   . AMA (advanced maternal age) multigravida 35+   . Fibroid    Past Surgical History  Procedure Laterality Date  . No past surgeries     Family History: family history includes Asthma in her son and Hypertension in her mother. Social History:  reports that she has never smoked. She does not have any smokeless tobacco history on file. She reports that she does not drink alcohol or use illicit drugs.   Prenatal Transfer Tool  Maternal Diabetes: No Genetic Screening: Abnormal:  Results: Elevated risk of Trisomy 21 Maternal Ultrasounds/Referrals: Normal Fetal Ultrasounds or other Referrals:  polyhydramnios Maternal Substance Abuse:  No Significant Maternal Medications:  None Significant Maternal Lab Results:  None Other Comments:  Uterine fibroids  ROS: as above  Dilation: 4.5 Effacement (%): 100 Station: -2 Exam by:: K.WIlson<RN Last menstrual period 09/28/2011. Exam Physical Exam  Prenatal labs: ABO, Rh: B/Positive/-- (09/10 0000) Antibody: Negative (09/10 0000) Rubella: Immune (09/10 0000) RPR: Nonreactive (09/10 0000)  HBsAg: Negative (09/10 0000)  HIV: Non-reactive  (09/10 0000)  GBS: Negative (02/06 0000)   Assessment/Plan: 1) Admit 2) Epidural 3) Expt mgt  Rickia Freeburg H. 11/08/2012, 9:22 AM

## 2012-11-09 ENCOUNTER — Encounter (HOSPITAL_COMMUNITY): Payer: Self-pay | Admitting: *Deleted

## 2012-11-09 LAB — CBC
HCT: 29.5 % — ABNORMAL LOW (ref 36.0–46.0)
Platelets: 227 10*3/uL (ref 150–400)
RDW: 15.7 % — ABNORMAL HIGH (ref 11.5–15.5)
WBC: 20.2 10*3/uL — ABNORMAL HIGH (ref 4.0–10.5)

## 2012-11-09 MED ORDER — COMPLETENATE 29-1 MG PO CHEW
1.0000 | CHEWABLE_TABLET | Freq: Every day | ORAL | Status: DC
  Administered 2012-11-09: 1 via ORAL
  Filled 2012-11-09 (×2): qty 1

## 2012-11-09 NOTE — Progress Notes (Signed)
Post Partum Day 1 Subjective: no complaints, up ad lib, voiding and tolerating PO  Objective: Blood pressure 134/91, pulse 92, temperature 97.8 F (36.6 C), temperature source Oral, resp. rate 20, height 5\' 3"  (1.6 m), weight 109.317 kg (241 lb), last menstrual period 09/28/2011, SpO2 100.00%, unknown if currently breastfeeding.  Physical Exam:  General: alert, cooperative and appears stated age Lochia: appropriate Uterine Fundus: firm   Recent Labs  11/08/12 1719 11/09/12 0620  HGB 10.9* 9.4*  HCT 34.0* 29.5*    Assessment/Plan: Routine PP Care Neonatal circ discussed, R/B/A reviewed  LOS: 1 day   Catrina Fellenz H. 11/09/2012, 10:00 AM

## 2012-11-09 NOTE — Anesthesia Postprocedure Evaluation (Signed)
Anesthesia Post Note  Patient: Ashley Gibson  Procedure(s) Performed: * No procedures listed *  Anesthesia type: Epidural  Patient location: Mother/Baby  Post pain: Pain level controlled  Post assessment: Post-op Vital signs reviewed  Last Vitals:  Filed Vitals:   11/09/12 0000  BP: 113/71  Pulse: 102  Temp: 37 C  Resp: 20    Post vital signs: Reviewed  Level of consciousness:alert  Complications: No apparent anesthesia complications

## 2012-11-10 ENCOUNTER — Inpatient Hospital Stay (HOSPITAL_COMMUNITY): Admission: RE | Admit: 2012-11-10 | Payer: No Typology Code available for payment source | Source: Ambulatory Visit

## 2012-11-10 MED ORDER — IBUPROFEN 600 MG PO TABS: 600.0000 mg | ORAL_TABLET | Freq: Four times a day (QID) | ORAL | Status: DC

## 2012-11-10 NOTE — Discharge Summary (Signed)
Obstetric Discharge Summary Reason for Admission: rupture of membranes Prenatal Procedures: NST Intrapartum Procedures: spontaneous vaginal delivery Postpartum Procedures: none Complications-Operative and Postpartum: 2nd degree perineal laceration Hemoglobin  Date Value Range Status  11/09/2012 9.4* 12.0 - 15.0 g/dL Final     HCT  Date Value Range Status  11/09/2012 29.5* 36.0 - 46.0 % Final    Physical Exam:  General: alert and cooperative Lochia: appropriate Uterine Fundus: firm DVT Evaluation: No evidence of DVT seen on physical exam.  Discharge Diagnoses: Term Pregnancy-delivered  Discharge Information: Date: 11/10/2012 Activity: pelvic rest Diet: routine Medications: PNV and Ibuprofen Condition: stable Instructions: refer to practice specific booklet Discharge to: home Follow-up Information   Follow up with Almon Hercules., MD In 4 weeks.   Contact information:   [address] [place] [address] [phone]       Newborn Data: Live born female  Birth Weight: 7 lb 6.2 oz (3351 g) APGAR: 8, 9  Home with mother.  Philip Aspen 11/10/2012, 9:27 AM

## 2012-11-11 LAB — TYPE AND SCREEN
Antibody Screen: NEGATIVE
Unit division: 0

## 2012-11-14 ENCOUNTER — Inpatient Hospital Stay (HOSPITAL_COMMUNITY): Payer: BC Managed Care – PPO | Admitting: Anesthesiology

## 2012-11-14 ENCOUNTER — Inpatient Hospital Stay (HOSPITAL_COMMUNITY)
Admission: AD | Admit: 2012-11-14 | Discharge: 2012-11-14 | Disposition: A | Discharge status: Home / Self Care | Payer: BC Managed Care – PPO | Source: Ambulatory Visit | Attending: Anesthesiology | Admitting: Anesthesiology

## 2012-11-14 ENCOUNTER — Encounter (HOSPITAL_COMMUNITY): Payer: Self-pay

## 2012-11-14 ENCOUNTER — Encounter (HOSPITAL_COMMUNITY): Payer: Self-pay | Admitting: Anesthesiology

## 2012-11-14 DIAGNOSIS — O9089 Other complications of the puerperium, not elsewhere classified: Secondary | ICD-10-CM

## 2012-11-14 DIAGNOSIS — O99893 Other specified diseases and conditions complicating puerperium: Secondary | ICD-10-CM | POA: Insufficient documentation

## 2012-11-14 DIAGNOSIS — R51 Headache: Secondary | ICD-10-CM | POA: Insufficient documentation

## 2012-11-14 LAB — COMPREHENSIVE METABOLIC PANEL
BUN: 13 mg/dL (ref 6–23)
CO2: 23 mEq/L (ref 19–32)
Calcium: 9.3 mg/dL (ref 8.4–10.5)
Creatinine, Ser: 0.91 mg/dL (ref 0.50–1.10)
GFR calc Af Amer: 90 mL/min — ABNORMAL LOW (ref >90)
GFR calc non Af Amer: 77 mL/min — ABNORMAL LOW (ref >90)
Glucose, Bld: 76 mg/dL (ref 70–99)
Sodium: 141 mEq/L (ref 135–145)
Total Protein: 6 g/dL (ref 6.0–8.3)

## 2012-11-14 LAB — URINE MICROSCOPIC-ADD ON

## 2012-11-14 LAB — URINALYSIS, ROUTINE W REFLEX MICROSCOPIC
Nitrite: NEGATIVE
Protein, ur: NEGATIVE mg/dL
Specific Gravity, Urine: <1.005 — ABNORMAL LOW (ref 1.005–1.030)
Urobilinogen, UA: 0.2 mg/dL (ref 0.0–1.0)

## 2012-11-14 LAB — URIC ACID: Uric Acid, Serum: 5.8 mg/dL (ref 2.4–7.0)

## 2012-11-14 LAB — CBC
HCT: 32 % — ABNORMAL LOW (ref 36.0–46.0)
Hemoglobin: 10.1 g/dL — ABNORMAL LOW (ref 12.0–15.0)
MCH: 25.3 pg — ABNORMAL LOW (ref 26.0–34.0)
MCHC: 31.6 g/dL (ref 30.0–36.0)
MCV: 80 fL (ref 78.0–100.0)
RBC: 4 MIL/uL (ref 3.87–5.11)

## 2012-11-14 MED ORDER — LACTATED RINGERS IV SOLN
INTRAVENOUS | Status: DC
  Administered 2012-11-14: 21:00:00 via INTRAVENOUS

## 2012-11-14 MED ORDER — FENTANYL CITRATE 0.05 MG/ML IJ SOLN
100.0000 ug | Freq: Once | INTRAMUSCULAR | Status: AC
  Administered 2012-11-14: 100 ug via INTRAVENOUS

## 2012-11-14 MED ORDER — FENTANYL CITRATE 0.05 MG/ML IJ SOLN
INTRAMUSCULAR | Status: AC
  Filled 2012-11-14: qty 2

## 2012-11-14 NOTE — MAU Provider Note (Signed)
History     CSN: [id number]  Arrival date and time: 11/14/12 1657   First Provider Initiated Contact with Patient 11/14/12 1756      Chief Complaint  Patient presents with  . Headache   HPI Ms. Ashley Gibson is a 42 y.o. [id number] who presents to MAU with complaint of headache. The patient delivered by SVD on 11/08/12 and was sent home on 11/10/12. She states that the headache began yesterday and progressively got worse. She rates the pain at 3/10 when resting and 9/10 when standing. She tried goody powder and Ibuprofen around noon today with little relief. She was seen in the office and told to come over for evaluation. She denies peripheral edema, RUQ pain, blurred vision or seeing spots. She also denies abdominal pain, excessive vaginal bleeding or fever.   OB History   Grav Para Term Preterm Abortions TAB SAB Ect Mult Living   2 2 2  0 0 0 0 0 0 2      Past Medical History  Diagnosis Date  . No pertinent past medical history   . UTI (lower urinary tract infection)   . Hx of chlamydia infection   . Hx of varicella   . AMA (advanced maternal age) multigravida 35+   . Fibroid     Past Surgical History  Procedure Laterality Date  . No past surgeries      Family History  Problem Relation Age of Onset  . Hypertension Mother   . Asthma Son     History  Substance Use Topics  . Smoking status: Never Smoker   . Smokeless tobacco: Never Used  . Alcohol Use: No    Allergies: No Known Allergies  Prescriptions prior to admission  Medication Sig Dispense Refill  . ibuprofen (ADVIL,MOTRIN) 600 MG tablet Take 1 tablet (600 mg total) by mouth every 6 (six) hours.  30 tablet  0  . Prenatal Vit-Fe Fumarate-FA (PRENATAL MULTIVITAMIN) TABS Take 1 tablet by mouth every morning.        Review of Systems  Constitutional: Negative for fever and malaise/fatigue.  Eyes: Negative for blurred vision and double vision.  Genitourinary: Negative for dysuria, urgency and frequency.   Neurological: Positive for headaches.   Physical Exam   Blood pressure 168/87, pulse 65, temperature 98.4 F (36.9 C), temperature source Oral, resp. rate 20, height 5\' 3"  (1.6 m), weight 216 lb (97.977 kg), last menstrual period 09/28/2011, SpO2 100.00%, not currently breastfeeding.  Physical Exam  Constitutional: She is oriented to person, place, and time. She appears well-developed and well-nourished. No distress.  HENT:  Head: Normocephalic and atraumatic.  Cardiovascular: Normal rate, regular rhythm and normal heart sounds.   Respiratory: Effort normal and breath sounds normal. No respiratory distress.  GI: Soft. Bowel sounds are normal. She exhibits no distension and no mass. There is no tenderness. There is no rebound and no guarding.  Neurological: She is alert and oriented to person, place, and time.  Skin: Skin is warm and dry. No erythema.  Psychiatric: She has a normal mood and affect.   Results for orders placed during the hospital encounter of 11/14/12 (from the past 24 hour(s))  CBC     Status: Abnormal   Collection Time    11/14/12  6:42 PM      Result Value Range   WBC 8.6  4.0 - 10.5 K/uL   RBC 4.00  3.87 - 5.11 MIL/uL   Hemoglobin 10.1 (*) 12.0 - 15.0 g/dL   HCT  32.0 (*) 36.0 - 46.0 %   MCV 80.0  78.0 - 100.0 fL   MCH 25.3 (*) 26.0 - 34.0 pg   MCHC 31.6  30.0 - 36.0 g/dL   RDW 16.1 (*) 09.6 - 04.5 %   Platelets 297  150 - 400 K/uL  COMPREHENSIVE METABOLIC PANEL     Status: Abnormal   Collection Time    11/14/12  6:42 PM      Result Value Range   Sodium 141  135 - 145 mEq/L   Potassium 4.2  3.5 - 5.1 mEq/L   Chloride 106  96 - 112 mEq/L   CO2 23  19 - 32 mEq/L   Glucose, Bld 76  70 - 99 mg/dL   BUN 13  6 - 23 mg/dL   Creatinine, Ser 4.09  0.50 - 1.10 mg/dL   Calcium 9.3  8.4 - 81.1 mg/dL   Total Protein 6.0  6.0 - 8.3 g/dL   Albumin 2.6 (*) 3.5 - 5.2 g/dL   AST 16  0 - 37 U/L   ALT 21  0 - 35 U/L   Alkaline Phosphatase 113  39 - 117 U/L   Total  Bilirubin 0.2 (*) 0.3 - 1.2 mg/dL   GFR calc non Af Amer 77 (*) >90 mL/min   GFR calc Af Amer 90 (*) >90 mL/min  URIC ACID     Status: None   Collection Time    11/14/12  6:42 PM      Result Value Range   Uric Acid, Serum 5.8  2.4 - 7.0 mg/dL  URINALYSIS, ROUTINE W REFLEX MICROSCOPIC     Status: Abnormal   Collection Time    11/14/12  7:10 PM      Result Value Range   Color, Urine YELLOW  YELLOW   APPearance CLEAR  CLEAR   Specific Gravity, Urine <1.005 (*) 1.005 - 1.030   pH 6.5  5.0 - 8.0   Glucose, UA NEGATIVE  NEGATIVE mg/dL   Hgb urine dipstick SMALL (*) NEGATIVE   Bilirubin Urine NEGATIVE  NEGATIVE   Ketones, ur NEGATIVE  NEGATIVE mg/dL   Protein, ur NEGATIVE  NEGATIVE mg/dL   Urobilinogen, UA 0.2  0.0 - 1.0 mg/dL   Nitrite NEGATIVE  NEGATIVE   Leukocytes, UA NEGATIVE  NEGATIVE  URINE MICROSCOPIC-ADD ON     Status: None   Collection Time    11/14/12  7:10 PM      Result Value Range   Squamous Epithelial / LPF RARE  RARE   WBC, UA 0-2  <3 WBC/hpf   RBC / HPF 0-2  <3 RBC/hpf   Bacteria, UA RARE  RARE    MAU Course  Procedures None  MDM Discussed patient with Dr. Henderson Cloud and labs ordered. Serial BPs obtained.  Patient is comfortable laying down.  20:00 Discussed labs, comfort level of the patient with Dr. Henderson Cloud.  Order given for anesthesia consult since [facility] labs and urine are negative and pain is associated with sitting as opposed to lying--positional.     20:10  Call to Dr. Malen Gauze, finishing a case and will call back.  20:15  Dr. Malen Gauze called back.  Will review her record and will see patient in MAU.   Dr. Malen Gauze in to evaluate patient.  He plans blood patch.   Dr. Henderson Cloud informed of plan at 21:00 21:40  Patient to OR for blood patch.  Report from [place], [place] that she will recover in PACU. Assessment and Plan  2000 - Care turned over to Jeani Sow, NP   A:  Post partal headache       PIH ruled out  P:  To OR for blood patch.  Freddi Starr, PA-C 11/14/2012,  6:05 PM

## 2012-11-14 NOTE — Anesthesia Preprocedure Evaluation (Signed)
Anesthesia Evaluation  Patient identified by MRN, date of birth, ID band Patient awake    Reviewed: Allergy & Precautions, H&P , Patient's Chart, lab work & pertinent test results  Airway Mallampati: III TM Distance: >3 FB Neck ROM: Full    Dental no notable dental hx. (+) Teeth Intact   Pulmonary neg pulmonary ROS,  breath sounds clear to auscultation- rhonchi  Pulmonary exam normal       Cardiovascular - Peripheral Vascular Disease negative cardio ROS  Rhythm:Regular Rate:Normal     Neuro/Psych  Headaches, Patient had lumbar epidural placed for pain control for vaginal delivery on 11/08/2012. Wet tap reported on 1st attempt by anesthesiologist. Patient developed severe HA yesterday which was somewhat relieved by supine position. She denies diplopia, nystagmus, blurring of vision, N/V, tinnitus or neck pain. HA is located top of head. HA interferes with her ADL.  negative psych ROS   GI/Hepatic negative GI ROS, Neg liver ROS,   Endo/Other  negative endocrine ROS  Renal/GU negative Renal ROS  negative genitourinary   Musculoskeletal negative musculoskeletal ROS (+)   Abdominal (+) + obese,   Peds  Hematology negative hematology ROS (+)   Anesthesia Other Findings   Reproductive/Obstetrics negative OB ROS                           Anesthesia Physical Anesthesia Plan  ASA: II  Anesthesia Plan:    Post-op Pain Management:    Induction:   Airway Management Planned:   Additional Equipment:   Intra-op Plan:   Post-operative Plan:   Informed Consent: I have reviewed the patients History and Physical, chart, labs and discussed the procedure including the risks, benefits and alternatives for the proposed anesthesia with the patient or authorized representative who has indicated his/her understanding and acceptance.     Plan Discussed with: Anesthesiologist and CRNA  Anesthesia Plan  Comments: (I have discussed probable post dural puncture HA given hx/o "wet tap" during placement of her epidural with both her and her husband. I discussed the etiology of her HA, pathophysiology as well as different modalities of treatment including doing nothing and waiting for HA to resolve, symptomatic treatment with analgesics, use of cosyntropin, IV caffeine as well as lumbar epidural blood patch. They voiced understanding and wish to proceed with lumbar epidural blood patch this evening.)        Anesthesia Quick Evaluation

## 2012-11-14 NOTE — MAU Note (Signed)
Pt states headache is noted, however not as intense when lying down, rates 3/10. When sitting up, rates headache 9/10. Pain noted in back base of neck and feels like a bad sinus headache.

## 2012-11-14 NOTE — Anesthesia Procedure Notes (Signed)
Epidural  Start time: 11/14/2012 10:05 PM  Staffing Anesthesiologist: Sandria Mcenroe A. Performed by: anesthesiologist   Preanesthetic Checklist Completed: patient identified, site marked, surgical consent, pre-op evaluation, timeout performed, IV checked, risks and benefits discussed and monitors and equipment checked  Epidural Patient position: sitting Prep: site prepped and draped and DuraPrep Patient monitoring: heart rate, continuous pulse ox and blood pressure Approach: midline Injection technique: LOR air  Needle:  Needle type: Tuohy  Needle gauge: 17 G Needle length: 9 cm Needle insertion depth: 6 cm  Additional Notes Patient ID'd, monitors applied, placed in sitting position, timeout performed. Patient given Fentanyl IV for procedure. Right AC area prepped with Duraprep and tourniquet applied to upper arm. Lower back prepped with Duraprep and sterile drapes applied. 5ml of Lidocaine 1% infiltrated into skin and subq at L3-L4 interspace. Epidural space ID'd with 17ga Touhy needle using LOR with air. No heme, no CSF and no paresthesias encountered.  20 ml of blood withdrawn from right AC vein using strict aseptic technique. 20 ml of blood then injected through the epidural needle which was then withdrawn. The patient was then placed in the supine position and observed in the PACU.Reason for block:Epidural Blood Patch

## 2012-11-14 NOTE — MAU Note (Signed)
Patient states she had a SVD on 3-15 and discharged on 3-17. States she started having a headache yesterday that is not going away.

## 2012-11-15 NOTE — Anesthesia Postprocedure Evaluation (Signed)
  Anesthesia Post-op Note  Patient: Ashley Gibson  Procedure(s) Performed: Lumbar Epidural Blood Patch  Patient Location: PACU  Anesthesia Type:Epidural  Level of Consciousness: awake, alert  and oriented  Airway and Oxygen Therapy: Patient Spontanous Breathing  Post-op Pain: none  Post-op Assessment: Post-op Vital signs reviewed, Patient's Cardiovascular Status Stable, Respiratory Function Stable, Patent Airway, No signs of Nausea or vomiting, Adequate PO intake, Pain level controlled, No headache, No backache, No residual numbness and No residual motor weakness  Post-op Vital Signs: Reviewed and stable  Complications: No apparent anesthesia complications
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
## 2014-06-28 ENCOUNTER — Encounter (HOSPITAL_COMMUNITY): Payer: Self-pay

## 2014-11-04 ENCOUNTER — Other Ambulatory Visit: Payer: Self-pay | Admitting: Obstetrics and Gynecology

## 2017-11-22 ENCOUNTER — Other Ambulatory Visit: Payer: Self-pay | Admitting: Obstetrics and Gynecology

## 2017-11-22 DIAGNOSIS — R928 Other abnormal and inconclusive findings on diagnostic imaging of breast: Secondary | ICD-10-CM

## 2017-11-25 ENCOUNTER — Other Ambulatory Visit: Payer: BC Managed Care – PPO

## 2017-11-26 ENCOUNTER — Other Ambulatory Visit: Payer: Self-pay | Admitting: Obstetrics and Gynecology

## 2017-11-26 ENCOUNTER — Ambulatory Visit
Admission: RE | Admit: 2017-11-26 | Discharge: 2017-11-26 | Disposition: A | Discharge status: Home / Self Care | Payer: BC Managed Care – PPO | Source: Ambulatory Visit | Attending: Obstetrics and Gynecology | Admitting: Obstetrics and Gynecology

## 2017-11-26 DIAGNOSIS — R928 Other abnormal and inconclusive findings on diagnostic imaging of breast: Secondary | ICD-10-CM

## 2017-11-26 DIAGNOSIS — N6310 Unspecified lump in the right breast, unspecified quadrant: Secondary | ICD-10-CM

## 2017-12-06 ENCOUNTER — Other Ambulatory Visit: Payer: BC Managed Care – PPO

## 2017-12-09 ENCOUNTER — Ambulatory Visit
Admission: RE | Admit: 2017-12-09 | Discharge: 2017-12-09 | Disposition: A | Discharge status: Home / Self Care | Payer: BC Managed Care – PPO | Source: Ambulatory Visit | Attending: Obstetrics and Gynecology | Admitting: Obstetrics and Gynecology

## 2017-12-09 ENCOUNTER — Other Ambulatory Visit: Payer: Self-pay | Admitting: Obstetrics and Gynecology

## 2017-12-09 DIAGNOSIS — N6310 Unspecified lump in the right breast, unspecified quadrant: Secondary | ICD-10-CM

## 2017-12-19 ENCOUNTER — Encounter: Payer: Self-pay | Admitting: General Surgery

## 2017-12-19 ENCOUNTER — Other Ambulatory Visit: Payer: Self-pay | Admitting: General Surgery

## 2017-12-19 DIAGNOSIS — N6011 Diffuse cystic mastopathy of right breast: Secondary | ICD-10-CM

## 2018-01-19 NOTE — H&P (Signed)
Ashley Gibson Location: [facility] Patient #: [id number] DOB: Apr 23, 1971 Married / Language: English / Race: Black or African American Female        History of Present Illness       The patient is a 48 year old female who presents with a complaint of multiple ductal papillomas right breast. This is a 47 year old female referred by Dr. Jacalyn Lefevre trauma at the [facility] for evaluation of ductal papillomatosis right breast subareolar area. One of her female friends is present throughout the encounter. Her PCP is Vanessa Kick.      The patient's has never had a breast operation in the past. She is noted clear nipple discharge since age 54. Intermittent with her cycles. She says the drainage is now is more watery and yellow. Mostly it is elicited. Not so much spontaneous. Never saw any blood. Does not feel a mass or pain. She gets yearly mammograms. This year, for the first time, they told her she had multiple dilated ducts and multiple intraductal masses. Biopsies were performed at the 4:00 and 7:00 periareolar area and this shows papillomas without atypia. She also had a cyst at the 1 o'clock position.       Past history is negative other than BMI 41 Family history is negative for breast, ovarian, colon, or prostate cancer. Mother living with hypertension diabetes. Father living but she does not know his medical history Social history reveals she is married with 2 children. Lives in [place]. 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 Denies tobacco. Takes alcohol occasionally.      I talked with her for a long time. I told her that she probably does not have cancer but there may be anywhere between 5 and 10% chance that she could have early cancer in these areas, especially since they are multiple. I described right breast lumpectomy with radioactive seed localization 2. I described a circumareolar incision. I told her that her nipple would  likely be insensate and flat after this She wants to go ahead and have this done. She'll be scheduled for right breast lumpectomy 2 with radioactive seed localization 2. Probably an inferiorly placed circumareolar margin incision. She understands the indications, techniques, details, and risks of the surgery. She is aware the risks of bleeding, infection, cosmetic deformity, sensory loss. Erectile function loss. Reoperation if this is cancer. She understands all these issues. All of her questions are answered. She agrees with this plan.   Past Surgical History  Colon Polyp Removal - Colonoscopy  Oral Surgery   Diagnostic Studies History  Colonoscopy  within last year Mammogram  within last year Pap Smear  1-5 years ago  Allergies No Known Drug Allergies  Allergies Reconciled   Medication History FLUoxetine HCl (10MG  Capsule, Oral) Active. Nexplanon (68MG  Implant, Subcutaneous) Active. Omeprazole (40MG  Capsule DR, Oral) Active. Medications Reconciled  Social History  Alcohol use  Occasional alcohol use. Caffeine use  Carbonated beverages, Coffee, Tea. No drug use  Tobacco use  Never smoker.  Family History  Alcohol Abuse  Father. Arthritis  Mother. Diabetes Mellitus  Mother. Hypertension  Mother.  Pregnancy / Birth History  Age at menarche  32 years. Contraceptive History  Contraceptive implant. Gravida  2 Irregular periods  Length (months) of breastfeeding  3-6 Maternal age  54-25 Para  2  Other Problems  Anxiety Disorder  Chest pain  Depression  Gastroesophageal Reflux Disease  Migraine Headache     Review of Systems  General Present- Fatigue  and Weight Gain. Not Present- Appetite Loss, Chills, Fever, Night Sweats and Weight Loss. Skin Present- Dryness. Not Present- Change in Wart/Mole, Hives, Jaundice, New Lesions, Non-Healing Wounds, Rash and Ulcer. HEENT Present- Wears glasses/contact lenses. Not Present- Earache,  Hearing Loss, Hoarseness, Nose Bleed, Oral Ulcers, Ringing in the Ears, Seasonal Allergies, Sinus Pain, Sore Throat, Visual Disturbances and Yellow Eyes. Respiratory Not Present- Bloody sputum, Chronic Cough, Difficulty Breathing, Snoring and Wheezing. Breast Present- Nipple Discharge. Not Present- Breast Mass, Breast Pain and Skin Changes. Cardiovascular Not Present- Chest Pain, Difficulty Breathing Lying Down, Leg Cramps, Palpitations, Rapid Heart Rate, Shortness of Breath and Swelling of Extremities. Gastrointestinal Present- Bloating and Indigestion. Not Present- Abdominal Pain, Bloody Stool, Change in Bowel Habits, Chronic diarrhea, Constipation, Difficulty Swallowing, Excessive gas, Gets full quickly at meals, Hemorrhoids, Nausea, Rectal Pain and Vomiting. Female Genitourinary Not Present- Frequency, Nocturia, Painful Urination, Pelvic Pain and Urgency. Musculoskeletal Present- Joint Stiffness. Not Present- Back Pain, Joint Pain, Muscle Pain, Muscle Weakness and Swelling of Extremities. Neurological Present- Numbness. Not Present- Decreased Memory, Fainting, Headaches, Seizures, Tingling, Tremor, Trouble walking and Weakness. Psychiatric Present- Anxiety. Not Present- Bipolar, Change in Sleep Pattern, Depression, Fearful and Frequent crying. Endocrine Not Present- Cold Intolerance, Excessive Hunger, Hair Changes, Heat Intolerance, Hot flashes and New Diabetes. Hematology Not Present- Blood Thinners, Easy Bruising, Excessive bleeding, Gland problems, HIV and Persistent Infections.  Vitals  Weight: 227 lb Height: 62in Body Surface Area: 2.02 m Body Mass Index: 41.52 kg/m  Temp.: 98.45F  Pulse: 100 (Regular)  BP: 138/94 (Sitting, Left Arm, Standard)    Physical Exam General Mental Status-Alert. General Appearance-Consistent with stated age. Hydration-Well hydrated. Voice-Normal. Note: BMI 41   Head and Neck Head-normocephalic, atraumatic with no lesions or  palpable masses. Trachea-midline. Thyroid Gland Characteristics - normal size and consistency.  Eye Eyeball - Bilateral-Extraocular movements intact. Sclera/Conjunctiva - Bilateral-No scleral icterus.  Chest and Lung Exam Chest and lung exam reveals -quiet, even and easy respiratory effort with no use of accessory muscles and on auscultation, normal breath sounds, no adventitious sounds and normal vocal resonance. Inspection Chest Wall - Normal. Back - normal.  Breast Note: Both breasts are examined. Breasts are large. Recent biopsy sites right breast medially and inferiorly. No hematoma. I can express a clearish watery discharge from the central right nipple duct. No obvious palpable masses on either side. No axillary adenopathy.   Cardiovascular Cardiovascular examination reveals -normal heart sounds, regular rate and rhythm with no murmurs and normal pedal pulses bilaterally.  Abdomen Inspection Inspection of the abdomen reveals - No Hernias. Skin - Scar - no surgical scars. Palpation/Percussion Palpation and Percussion of the abdomen reveal - Soft, Non Tender, No Rebound tenderness, No Rigidity (guarding) and No hepatosplenomegaly. Auscultation Auscultation of the abdomen reveals - Bowel sounds normal.  Neurologic Neurologic evaluation reveals -alert and oriented x 3 with no impairment of recent or remote memory. Mental Status-Normal.  Musculoskeletal Normal Exam - Left-Upper Extremity Strength Normal and Lower Extremity Strength Normal. Normal Exam - Right-Upper Extremity Strength Normal and Lower Extremity Strength Normal.  Lymphatic Head & Neck  General Head & Neck Lymphatics: Bilateral - Description - Normal. Axillary  General Axillary Region: Bilateral - Description - Normal. Tenderness - Non Tender. Femoral & Inguinal  Generalized Femoral & Inguinal Lymphatics: Bilateral - Description - Normal. Tenderness - Non Tender.    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)  You have had right nipple discharge for several years Your recent imaging studies shows multiple  intraductal masses under the right nipple and dilated ducts 2 biopsies were performed of the right breast, one at the 4 o'clock position and another at the 7 o'clock position These both showed intraductal papillomas but no cancer was seen I gave you a copy of your pathology report  These are high risk lesions carrying at least a 5% chance of having cancer They are also the cause of your nipple discharge  You have stated that you would like to go ahead and have these areas excised This will clarify the diagnosis and should stop the nipple discharge. I have discussed the indications, techniques, and risks of the surgery in detail  BMI 40.0-44.9, ADULT (Z68.41)    Ashley Gibson. Dalbert Batman, M.D., [facility] General and Minimally invasive Surgery Breast and Colorectal Surgery Office:   [phone] Pager:   [phone]

## 2018-01-21 ENCOUNTER — Other Ambulatory Visit: Payer: Self-pay

## 2018-01-21 ENCOUNTER — Encounter (HOSPITAL_BASED_OUTPATIENT_CLINIC_OR_DEPARTMENT_OTHER): Payer: Self-pay | Admitting: *Deleted

## 2018-01-23 ENCOUNTER — Ambulatory Visit
Admission: RE | Admit: 2018-01-23 | Discharge: 2018-01-23 | Disposition: A | Discharge status: Home / Self Care | Payer: BC Managed Care – PPO | Source: Ambulatory Visit | Attending: General Surgery | Admitting: General Surgery

## 2018-01-23 DIAGNOSIS — N6011 Diffuse cystic mastopathy of right breast: Secondary | ICD-10-CM

## 2018-01-23 NOTE — Progress Notes (Signed)
Ensure pre surgery drink given with instructions to complete by 0430 dos, pt verbalized understanding. 

## 2018-01-24 ENCOUNTER — Ambulatory Visit (HOSPITAL_BASED_OUTPATIENT_CLINIC_OR_DEPARTMENT_OTHER): Payer: BC Managed Care – PPO | Admitting: Anesthesiology

## 2018-01-24 ENCOUNTER — Ambulatory Visit (HOSPITAL_BASED_OUTPATIENT_CLINIC_OR_DEPARTMENT_OTHER)
Admission: RE | Admit: 2018-01-24 | Discharge: 2018-01-24 | Disposition: A | Discharge status: Home / Self Care | Payer: BC Managed Care – PPO | Source: Ambulatory Visit | Attending: General Surgery | Admitting: General Surgery

## 2018-01-24 ENCOUNTER — Other Ambulatory Visit: Payer: Self-pay

## 2018-01-24 ENCOUNTER — Encounter (HOSPITAL_BASED_OUTPATIENT_CLINIC_OR_DEPARTMENT_OTHER): Payer: Self-pay | Admitting: *Deleted

## 2018-01-24 ENCOUNTER — Encounter (HOSPITAL_BASED_OUTPATIENT_CLINIC_OR_DEPARTMENT_OTHER)
Admission: RE | Disposition: A | Discharge status: Home / Self Care | Payer: Self-pay | Source: Ambulatory Visit | Attending: General Surgery

## 2018-01-24 ENCOUNTER — Ambulatory Visit
Admission: RE | Admit: 2018-01-24 | Discharge: 2018-01-24 | Disposition: A | Discharge status: Home / Self Care | Payer: BC Managed Care – PPO | Source: Ambulatory Visit | Attending: General Surgery | Admitting: General Surgery

## 2018-01-24 DIAGNOSIS — K219 Gastro-esophageal reflux disease without esophagitis: Secondary | ICD-10-CM | POA: Insufficient documentation

## 2018-01-24 DIAGNOSIS — D241 Benign neoplasm of right breast: Secondary | ICD-10-CM | POA: Insufficient documentation

## 2018-01-24 DIAGNOSIS — N6011 Diffuse cystic mastopathy of right breast: Secondary | ICD-10-CM | POA: Diagnosis not present

## 2018-01-24 DIAGNOSIS — F419 Anxiety disorder, unspecified: Secondary | ICD-10-CM | POA: Diagnosis not present

## 2018-01-24 DIAGNOSIS — N6452 Nipple discharge: Secondary | ICD-10-CM | POA: Diagnosis not present

## 2018-01-24 DIAGNOSIS — Z79899 Other long term (current) drug therapy: Secondary | ICD-10-CM | POA: Insufficient documentation

## 2018-01-24 DIAGNOSIS — F329 Major depressive disorder, single episode, unspecified: Secondary | ICD-10-CM | POA: Diagnosis not present

## 2018-01-24 HISTORY — DX: Major depressive disorder, single episode, unspecified: F32.9

## 2018-01-24 HISTORY — DX: Diffuse cystic mastopathy of right breast: N60.11

## 2018-01-24 HISTORY — PX: BREAST LUMPECTOMY WITH RADIOACTIVE SEED LOCALIZATION: SHX6424

## 2018-01-24 HISTORY — DX: Gastro-esophageal reflux disease without esophagitis: K21.9

## 2018-01-24 HISTORY — DX: Anxiety disorder, unspecified: F41.9

## 2018-01-24 HISTORY — DX: Depression, unspecified: F32.A

## 2018-01-24 HISTORY — DX: Benign neoplasm of unspecified breast: D24.9

## 2018-01-24 SURGERY — BREAST LUMPECTOMY WITH RADIOACTIVE SEED LOCALIZATION
Anesthesia: General | Site: Breast | Laterality: Right

## 2018-01-24 MED ORDER — DEXAMETHASONE SODIUM PHOSPHATE 10 MG/ML IJ SOLN
INTRAMUSCULAR | Status: AC
  Filled 2018-01-24: qty 1

## 2018-01-24 MED ORDER — PROPOFOL 10 MG/ML IV BOLUS
INTRAVENOUS | Status: AC
  Filled 2018-01-24: qty 40

## 2018-01-24 MED ORDER — CELECOXIB 200 MG PO CAPS
200.0000 mg | ORAL_CAPSULE | ORAL | Status: AC
  Administered 2018-01-24: 200 mg via ORAL

## 2018-01-24 MED ORDER — CHLORHEXIDINE GLUCONATE CLOTH 2 % EX PADS: 6.0000 | MEDICATED_PAD | Freq: Once | CUTANEOUS | Status: DC

## 2018-01-24 MED ORDER — OXYCODONE HCL 5 MG PO TABS: 5.0000 mg | ORAL_TABLET | Freq: Once | ORAL | Status: DC | PRN

## 2018-01-24 MED ORDER — ONDANSETRON HCL 4 MG/2ML IJ SOLN
INTRAMUSCULAR | Status: AC
  Filled 2018-01-24: qty 2

## 2018-01-24 MED ORDER — GABAPENTIN 300 MG PO CAPS
ORAL_CAPSULE | ORAL | Status: AC
  Filled 2018-01-24: qty 1

## 2018-01-24 MED ORDER — CEFAZOLIN SODIUM-DEXTROSE 2-4 GM/100ML-% IV SOLN
INTRAVENOUS | Status: AC
  Filled 2018-01-24: qty 100

## 2018-01-24 MED ORDER — PROPOFOL 10 MG/ML IV BOLUS
INTRAVENOUS | Status: DC | PRN
  Administered 2018-01-24: 200 mg via INTRAVENOUS

## 2018-01-24 MED ORDER — GABAPENTIN 300 MG PO CAPS
300.0000 mg | ORAL_CAPSULE | ORAL | Status: AC
  Administered 2018-01-24: 300 mg via ORAL

## 2018-01-24 MED ORDER — LACTATED RINGERS IV SOLN
INTRAVENOUS | Status: DC
  Administered 2018-01-24: 07:00:00 via INTRAVENOUS

## 2018-01-24 MED ORDER — FENTANYL CITRATE (PF) 100 MCG/2ML IJ SOLN
50.0000 ug | INTRAMUSCULAR | Status: DC | PRN
  Administered 2018-01-24: 100 ug via INTRAVENOUS

## 2018-01-24 MED ORDER — ACETAMINOPHEN 500 MG PO TABS
1000.0000 mg | ORAL_TABLET | ORAL | Status: AC
  Administered 2018-01-24: 1000 mg via ORAL

## 2018-01-24 MED ORDER — OXYCODONE HCL 5 MG/5ML PO SOLN: 5.0000 mg | Freq: Once | ORAL | Status: DC | PRN

## 2018-01-24 MED ORDER — CEFAZOLIN SODIUM-DEXTROSE 2-3 GM-%(50ML) IV SOLR
INTRAVENOUS | Status: DC | PRN
  Administered 2018-01-24: 2 g via INTRAVENOUS

## 2018-01-24 MED ORDER — LIDOCAINE HCL (CARDIAC) PF 100 MG/5ML IV SOSY
PREFILLED_SYRINGE | INTRAVENOUS | Status: DC | PRN
  Administered 2018-01-24: 30 mg via INTRAVENOUS

## 2018-01-24 MED ORDER — ONDANSETRON HCL 4 MG/2ML IJ SOLN
INTRAMUSCULAR | Status: AC
Start: 2018-01-24 — End: ?
  Filled 2018-01-24: qty 2

## 2018-01-24 MED ORDER — LIDOCAINE HCL (CARDIAC) PF 100 MG/5ML IV SOSY
PREFILLED_SYRINGE | INTRAVENOUS | Status: AC
  Filled 2018-01-24: qty 5

## 2018-01-24 MED ORDER — PHENYLEPHRINE HCL 10 MG/ML IJ SOLN
INTRAMUSCULAR | Status: DC | PRN
  Administered 2018-01-24 (×2): 120 ug via INTRAVENOUS
  Administered 2018-01-24 (×4): 80 ug via INTRAVENOUS

## 2018-01-24 MED ORDER — MIDAZOLAM HCL 2 MG/2ML IJ SOLN
1.0000 mg | INTRAMUSCULAR | Status: DC | PRN
  Administered 2018-01-24: 2 mg via INTRAVENOUS

## 2018-01-24 MED ORDER — CELECOXIB 200 MG PO CAPS
ORAL_CAPSULE | ORAL | Status: AC
  Filled 2018-01-24: qty 1

## 2018-01-24 MED ORDER — FENTANYL CITRATE (PF) 100 MCG/2ML IJ SOLN
INTRAMUSCULAR | Status: AC
  Filled 2018-01-24: qty 2

## 2018-01-24 MED ORDER — SCOPOLAMINE 1 MG/3DAYS TD PT72
1.0000 | MEDICATED_PATCH | Freq: Once | TRANSDERMAL | Status: DC | PRN
  Administered 2018-01-24: 1.5 mg via TRANSDERMAL

## 2018-01-24 MED ORDER — ACETAMINOPHEN 500 MG PO TABS
ORAL_TABLET | ORAL | Status: AC
  Filled 2018-01-24: qty 2

## 2018-01-24 MED ORDER — ONDANSETRON HCL 4 MG/2ML IJ SOLN
4.0000 mg | Freq: Once | INTRAMUSCULAR | Status: AC | PRN
  Administered 2018-01-24: 4 mg via INTRAVENOUS

## 2018-01-24 MED ORDER — SCOPOLAMINE 1 MG/3DAYS TD PT72
MEDICATED_PATCH | TRANSDERMAL | Status: AC
  Filled 2018-01-24: qty 1

## 2018-01-24 MED ORDER — DEXTROSE 5 % IV SOLN: 3.0000 g | INTRAVENOUS | Status: DC

## 2018-01-24 MED ORDER — BUPIVACAINE-EPINEPHRINE (PF) 0.5% -1:200000 IJ SOLN
INTRAMUSCULAR | Status: DC | PRN
  Administered 2018-01-24: 10 mL

## 2018-01-24 MED ORDER — HYDROCODONE-ACETAMINOPHEN 5-325 MG PO TABS: 1.0000 | ORAL_TABLET | Freq: Four times a day (QID) | ORAL | 0 refills | Status: DC | PRN

## 2018-01-24 MED ORDER — BUPIVACAINE-EPINEPHRINE (PF) 0.5% -1:200000 IJ SOLN
INTRAMUSCULAR | Status: AC
  Filled 2018-01-24: qty 30

## 2018-01-24 MED ORDER — FENTANYL CITRATE (PF) 100 MCG/2ML IJ SOLN: 25.0000 ug | INTRAMUSCULAR | Status: DC | PRN

## 2018-01-24 MED ORDER — MIDAZOLAM HCL 2 MG/2ML IJ SOLN
INTRAMUSCULAR | Status: AC
  Filled 2018-01-24: qty 2

## 2018-01-24 SURGICAL SUPPLY — 66 items
APPLIER CLIP 9.375 MED OPEN (MISCELLANEOUS)
BENZOIN TINCTURE PRP APPL 2/3 (GAUZE/BANDAGES/DRESSINGS) IMPLANT
BINDER BREAST LRG (GAUZE/BANDAGES/DRESSINGS) IMPLANT
BINDER BREAST MEDIUM (GAUZE/BANDAGES/DRESSINGS) IMPLANT
BINDER BREAST XLRG (GAUZE/BANDAGES/DRESSINGS) IMPLANT
BINDER BREAST XXLRG (GAUZE/BANDAGES/DRESSINGS) ×3 IMPLANT
BLADE HEX COATED 2.75 (ELECTRODE) ×3 IMPLANT
BLADE SURG 10 STRL SS (BLADE) IMPLANT
BLADE SURG 15 STRL LF DISP TIS (BLADE) ×1 IMPLANT
BLADE SURG 15 STRL SS (BLADE) ×2
CANISTER SUC SOCK COL 7IN (MISCELLANEOUS) IMPLANT
CANISTER SUCT 1200ML W/VALVE (MISCELLANEOUS) ×3 IMPLANT
CHLORAPREP W/TINT 26ML (MISCELLANEOUS) ×3 IMPLANT
CLIP APPLIE 9.375 MED OPEN (MISCELLANEOUS) IMPLANT
CLOSURE WOUND 1/2 X4 (GAUZE/BANDAGES/DRESSINGS)
COVER BACK TABLE 60X90IN (DRAPES) ×3 IMPLANT
COVER MAYO STAND STRL (DRAPES) ×3 IMPLANT
COVER PROBE W GEL 5X96 (DRAPES) ×3 IMPLANT
DECANTER SPIKE VIAL GLASS SM (MISCELLANEOUS) IMPLANT
DERMABOND ADVANCED (GAUZE/BANDAGES/DRESSINGS) ×2
DERMABOND ADVANCED .7 DNX12 (GAUZE/BANDAGES/DRESSINGS) ×1 IMPLANT
DEVICE DUBIN W/COMP PLATE 8390 (MISCELLANEOUS) ×3 IMPLANT
DRAPE LAPAROSCOPIC ABDOMINAL (DRAPES) ×3 IMPLANT
DRAPE UTILITY XL STRL (DRAPES) ×3 IMPLANT
DRSG PAD ABDOMINAL 8X10 ST (GAUZE/BANDAGES/DRESSINGS) ×3 IMPLANT
ELECT REM PT RETURN 9FT ADLT (ELECTROSURGICAL) ×3
ELECTRODE REM PT RTRN 9FT ADLT (ELECTROSURGICAL) ×1 IMPLANT
GAUZE SPONGE 4X4 12PLY STRL LF (GAUZE/BANDAGES/DRESSINGS) ×3 IMPLANT
GLOVE BIO SURGEON STRL SZ 6.5 (GLOVE) ×2 IMPLANT
GLOVE BIO SURGEONS STRL SZ 6.5 (GLOVE) ×1
GLOVE BIOGEL PI IND STRL 7.0 (GLOVE) ×2 IMPLANT
GLOVE BIOGEL PI IND STRL 7.5 (GLOVE) ×1 IMPLANT
GLOVE BIOGEL PI INDICATOR 7.0 (GLOVE) ×4
GLOVE BIOGEL PI INDICATOR 7.5 (GLOVE) ×2
GLOVE EUDERMIC 7 POWDERFREE (GLOVE) ×3 IMPLANT
GLOVE SURG SS PI 7.5 STRL IVOR (GLOVE) ×3 IMPLANT
GOWN STRL REUS W/ TWL LRG LVL3 (GOWN DISPOSABLE) ×1 IMPLANT
GOWN STRL REUS W/ TWL XL LVL3 (GOWN DISPOSABLE) ×2 IMPLANT
GOWN STRL REUS W/TWL LRG LVL3 (GOWN DISPOSABLE) ×2
GOWN STRL REUS W/TWL XL LVL3 (GOWN DISPOSABLE) ×4
ILLUMINATOR WAVEGUIDE N/F (MISCELLANEOUS) IMPLANT
KIT MARKER MARGIN INK (KITS) ×3 IMPLANT
LIGHT WAVEGUIDE WIDE FLAT (MISCELLANEOUS) IMPLANT
NEEDLE HYPO 25X1 1.5 SAFETY (NEEDLE) ×3 IMPLANT
NS IRRIG 1000ML POUR BTL (IV SOLUTION) ×3 IMPLANT
PACK BASIN DAY SURGERY FS (CUSTOM PROCEDURE TRAY) ×3 IMPLANT
PENCIL BUTTON HOLSTER BLD 10FT (ELECTRODE) ×3 IMPLANT
SHEET MEDIUM DRAPE 40X70 STRL (DRAPES) IMPLANT
SLEEVE SCD COMPRESS KNEE MED (MISCELLANEOUS) ×3 IMPLANT
SPONGE LAP 18X18 RF (DISPOSABLE) ×3 IMPLANT
SPONGE LAP 4X18 RFD (DISPOSABLE) ×3 IMPLANT
STRIP CLOSURE SKIN 1/2X4 (GAUZE/BANDAGES/DRESSINGS) IMPLANT
SUT ETHILON 3 0 FSL (SUTURE) IMPLANT
SUT MNCRL AB 4-0 PS2 18 (SUTURE) ×3 IMPLANT
SUT SILK 2 0 SH (SUTURE) ×3 IMPLANT
SUT VIC AB 2-0 CT1 27 (SUTURE) ×4
SUT VIC AB 2-0 CT1 TAPERPNT 27 (SUTURE) ×2 IMPLANT
SUT VIC AB 3-0 SH 27 (SUTURE)
SUT VIC AB 3-0 SH 27X BRD (SUTURE) IMPLANT
SUT VICRYL 3-0 CR8 SH (SUTURE) ×3 IMPLANT
SYR 10ML LL (SYRINGE) ×3 IMPLANT
TOWEL GREEN STERILE FF (TOWEL DISPOSABLE) ×3 IMPLANT
TOWEL OR NON WOVEN STRL DISP B (DISPOSABLE) IMPLANT
TUBE CONNECTING 20'X1/4 (TUBING) ×1
TUBE CONNECTING 20X1/4 (TUBING) ×2 IMPLANT
YANKAUER SUCT BULB TIP NO VENT (SUCTIONS) ×3 IMPLANT

## 2018-01-24 NOTE — Transfer of Care (Signed)
Immediate Anesthesia Transfer of Care Note  Patient: Ashley Gibson  Procedure(s) Performed: RIGHT BREAST LUMPECTOMY X2 WITH RADIOACTIVE SEED LOCALIZATION X2 (Right Breast)  Patient Location: PACU  Anesthesia Type:General  Level of Consciousness: awake, alert  and oriented  Airway & Oxygen Therapy: Patient Spontanous Breathing and Patient connected to face mask oxygen  Post-op Assessment: Report given to RN and Post -op Vital signs reviewed and stable  Post vital signs: Reviewed and stable  Last Vitals:  Vitals Value Taken Time  BP 135/91 01/24/2018  8:30 AM  Temp    Pulse 87 01/24/2018  8:32 AM  Resp    SpO2 100 % 01/24/2018  8:32 AM  Vitals shown include unvalidated device data.  Last Pain:  Vitals:   01/24/18 0649  TempSrc: Oral  PainSc:          Complications: No apparent anesthesia complications

## 2018-01-24 NOTE — Op Note (Signed)
Patient Name:           Ashley Gibson   Date of Surgery:        01/24/2018  Pre op Diagnosis:      Multiple intraductal papillomas, right breast with nipple discharge  Post op Diagnosis:    Same  Procedure:                 Right breast lumpectomy x2 with radioactive seed localization x2  Surgeon:                     Edsel Petrin. Dalbert Batman, M.D., FACS  Assistant:                      OR staff  Operative Indications:    This is a 47 year old female referred by Dr. Alvino Chapel  at the [facility] for evaluation of ductal papillomatosis right breast subareolar area. Her PCP is Vanessa Kick.      The patient's has never had a breast operation in the past. She is noted clear nipple discharge since age 89. Intermittent with her cycles. She says the drainage is now is more watery and yellow. Mostly it is elicited. Not so much spontaneous. Never saw any blood. Does not feel a mass or pain. She gets yearly mammograms. This year, for the first time, they told her she had multiple dilated ducts and multiple intraductal masses. Biopsies were performed at the 4:00 and 7:00 periareolar area and this shows papillomas without atypia. Family history is negative for breast, ovarian, colon, or prostate cancer.        I told her that she probably does not have cancer but there may be anywhere between 5 and 10% chance that she could have early cancer in these areas, especially since they are multiple. I described right breast lumpectomy with radioactive seed localization 2. I described a circumareolar incision. I told her that her nipple would likely be insensate and flat after this She'll be scheduled for right breast lumpectomy 2 with radioactive seed localization 2. . She agrees with this plan.    Operative Findings:       The radioactive seed and marker clips were easily visualized on the mammogram and were localized with the neoprobe at the 4:00 and 7:00 positions.  Specimen mammogram looked good  containing both seeds and both titanium marker clips.  She had dilated subareolar ducts with clear fluid within them.  There was no palpable neoplastic mass  Procedure in Detail:          Following the induction of general LMA anesthesia the patient's right breast was prepped and draped in a sterile fashion.  Surgical timeout was performed.  Intravenous antibiotics were given.  0.5% Marcaine with epinephrine was used as a local infiltration anesthetic.     The neoprobe was used to localize the radioactive seeds.  I planned a circumareolar incision, at the areolar margin, inferiorly based.  The incision was made with a knife.  Generous lumpectomy was performed through the single incision using the neoprobe and a cautery.  The specimen was marked with silk sutures and a 6 color ink kit to orient the pathologist.  Specimen mammogram looked good as described above.  The specimen was sent to the lab where the seeds were retrieved.  Hemostasis was excellent and achieved with electrocautery.  The wound was irrigated with saline.  5 metal marker clips were placed in the walls of the lumpectomy cavity.  The deeper breast tissues were closed with 2-0 Vicryl sutures.  More superficial layers of the breast tissue were closed with 3-0 Vicryl sutures.  The skin was closed with a running subcuticular 4-0 Monocryl and Dermabond.  Dry bandages and a breast binder were placed.  The patient tolerated the procedure well and was taken to PACU in stable condition.  EBL 25 cc.  Counts correct.  Complications none.     Addendum: I logged onto the [employer] and reviewed her prescription medication history     [place] M. Dalbert Batman, M.D., FACS General and Minimally Invasive Surgery Breast and Colorectal Surgery  01/24/2018 8:27 AM

## 2018-01-24 NOTE — Anesthesia Preprocedure Evaluation (Signed)
Anesthesia Evaluation  Patient identified by MRN, date of birth, ID band Patient awake    Reviewed: Allergy & Precautions, NPO status , Patient's Chart, lab work & pertinent test results  Airway Mallampati: II  TM Distance: >3 FB Neck ROM: Full    Dental  (+) Teeth Intact, Dental Advisory Given   Pulmonary    breath sounds clear to auscultation       Cardiovascular  Rhythm:Regular Rate:Normal     Neuro/Psych    GI/Hepatic   Endo/Other    Renal/GU      Musculoskeletal   Abdominal   Peds  Hematology   Anesthesia Other Findings   Reproductive/Obstetrics                             Anesthesia Physical Anesthesia Plan  ASA: II  Anesthesia Plan: General   Post-op Pain Management:    Induction: Intravenous  PONV Risk Score and Plan: Ondansetron and Dexamethasone  Airway Management Planned: LMA  Additional Equipment:   Intra-op Plan:   Post-operative Plan:   Informed Consent:   Plan Discussed with: Anesthesiologist and CRNA  Anesthesia Plan Comments:         Anesthesia Quick Evaluation

## 2018-01-24 NOTE — Discharge Instructions (Signed)
NO TYLENOL BEFORE 1 PM today!      [place] Office Phone Number [phone]  BREAST BIOPSY/ PARTIAL MASTECTOMY: POST OP INSTRUCTIONS  Always review your discharge instruction sheet given to you by the facility where your surgery was performed.  IF YOU HAVE DISABILITY OR FAMILY LEAVE FORMS, YOU MUST BRING THEM TO THE OFFICE FOR PROCESSING.  DO NOT GIVE THEM TO YOUR DOCTOR.  1. A prescription for pain medication may be given to you upon discharge.  Take your pain medication as prescribed, if needed.  If narcotic pain medicine is not needed, then you may take acetaminophen (Tylenol) or ibuprofen (Advil) as needed. 2. Take your usually prescribed medications unless otherwise directed 3. If you need a refill on your pain medication, please contact your pharmacy.  They will contact our office to request authorization.  Prescriptions will not be filled after 5pm or on week-ends. 4. You should eat very light the first 24 hours after surgery, such as soup, crackers, pudding, etc.  Resume your normal diet the day after surgery. 5. Most patients will experience some swelling and bruising in the breast.  Ice packs and a good support bra will help.  Swelling and bruising can take several days to resolve.  6. It is common to experience some constipation if taking pain medication after surgery.  Increasing fluid intake and taking a stool softener will usually help or prevent this problem from occurring.  A mild laxative (Milk of Magnesia or Miralax) should be taken according to package directions if there are no bowel movements after 48 hours. 7. Unless discharge instructions indicate otherwise, you may remove your bandages 24-48 hours after surgery, and you may shower at that time.  You may have steri-strips (small skin tapes) in place directly over the incision.  These strips should be left on the skin for 7-10 days.  If your surgeon used skin glue on the incision, you may shower in 24  hours.  The glue will flake off over the next 2-3 weeks.  Any sutures or staples will be removed at the office during your follow-up visit. 8. ACTIVITIES:  You may resume regular daily activities (gradually increasing) beginning the next day.  Wearing a good support bra or sports bra minimizes pain and swelling.  You may have sexual intercourse when it is comfortable. a. You may drive when you no longer are taking prescription pain medication, you can comfortably wear a seatbelt, and you can safely maneuver your car and apply brakes. b. RETURN TO WORK:  ______________________________________________________________________________________ 9. You should see your doctor in the office for a follow-up appointment approximately two weeks after your surgery.  Your doctors nurse will typically make your follow-up appointment when she calls you with your pathology report.  Expect your pathology report 2-3 business days after your surgery.  You may call to check if you do not hear from [place] after three days. 10. OTHER INSTRUCTIONS: _______________________________________________________________________________________________ _____________________________________________________________________________________________________________________________________ _____________________________________________________________________________________________________________________________________ _____________________________________________________________________________________________________________________________________  WHEN TO CALL YOUR DOCTOR: 1. Fever over 101.0 2. Nausea and/or vomiting. 3. Extreme swelling or bruising. 4. Continued bleeding from incision. 5. Increased pain, redness, or drainage from the incision.  The clinic staff is available to answer your questions during regular business hours.  Please dont hesitate to call and ask to speak to one of the nurses for clinical concerns.  If you have a  medical emergency, go to the nearest emergency room or call 911.  A surgeon from [facility] is always on call  at the hospital.  For further questions, please visit [email or web address]      Post Anesthesia Home Care Instructions  Activity: Get plenty of rest for the remainder of the day. A responsible individual must stay with you for 24 hours following the procedure.  For the next 24 hours, DO NOT: -Drive a car -Paediatric nurse -Drink alcoholic beverages -Take any medication unless instructed by your physician -Make any legal decisions or sign important papers.  Meals: Start with liquid foods such as gelatin or soup. Progress to regular foods as tolerated. Avoid greasy, spicy, heavy foods. If nausea and/or vomiting occur, drink only clear liquids until the nausea and/or vomiting subsides. Call your physician if vomiting continues.  Special Instructions/Symptoms: Your throat may feel dry or sore from the anesthesia or the breathing tube placed in your throat during surgery. If this causes discomfort, gargle with warm salt water. The discomfort should disappear within 24 hours.  If you had a scopolamine patch placed behind your ear for the management of post- operative nausea and/or vomiting:  1. The medication in the patch is effective for 72 hours, after which it should be removed.  Wrap patch in a tissue and discard in the trash. Wash hands thoroughly with soap and water. 2. You may remove the patch earlier than 72 hours if you experience unpleasant side effects which may include dry mouth, dizziness or visual disturbances. 3. Avoid touching the patch. Wash your hands with soap and water after contact with the patch.

## 2018-01-24 NOTE — Interval H&P Note (Signed)
History and Physical Interval Note:  01/24/2018 6:49 AM  Ashley Gibson  has presented today for surgery, with the diagnosis of RIGHT BREAST PAPILLOMAS X2  The various methods of treatment have been discussed with the patient and family. After consideration of risks, benefits and other options for treatment, the patient has consented to  Procedure(s): RIGHT BREAST LUMPECTOMY X2 WITH RADIOACTIVE SEED LOCALIZATION X2 (Right) as a surgical intervention .  The patient's history has been reviewed, patient examined, no change in status, stable for surgery.  I have reviewed the patient's chart and labs.  Questions were answered to the patient's satisfaction.     [name]

## 2018-01-24 NOTE — Anesthesia Procedure Notes (Signed)
Procedure Name: LMA Insertion Performed by: Emir Nack M, CRNA Pre-anesthesia Checklist: Patient identified, Emergency Drugs available, Suction available, Patient being monitored and Timeout performed Patient Re-evaluated:Patient Re-evaluated prior to induction Oxygen Delivery Method: Circle system utilized Preoxygenation: Pre-oxygenation with 100% oxygen Induction Type: IV induction LMA: LMA inserted LMA Size: 4.0 Tube type: Oral Placement Confirmation: positive ETCO2,  CO2 detector and breath sounds checked- equal and bilateral Tube secured with: Tape Dental Injury: Teeth and Oropharynx as per pre-operative assessment        

## 2018-01-24 NOTE — Anesthesia Postprocedure Evaluation (Signed)
Anesthesia Post Note  Patient: Ashley Gibson  Procedure(s) Performed: RIGHT BREAST LUMPECTOMY X2 WITH RADIOACTIVE SEED LOCALIZATION X2 (Right Breast)     Patient location during evaluation: PACU Anesthesia Type: General Level of consciousness: awake and alert Pain management: pain level controlled Vital Signs Assessment: post-procedure vital signs reviewed and stable Respiratory status: spontaneous breathing, nonlabored ventilation, respiratory function stable and patient connected to nasal cannula oxygen Cardiovascular status: blood pressure returned to baseline and stable Postop Assessment: no apparent nausea or vomiting Anesthetic complications: no    Last Vitals:  Vitals:   01/24/18 0930 01/24/18 1000  BP:  (!) 154/92  Pulse: 67 67  Resp: 14 16  Temp:  36.6 C  SpO2: 100% 100%    Last Pain:  Vitals:   01/24/18 1000  TempSrc:   PainSc: 0-No pain                 Dennis Killilea COKER

## 2018-01-27 ENCOUNTER — Encounter (HOSPITAL_BASED_OUTPATIENT_CLINIC_OR_DEPARTMENT_OTHER): Payer: Self-pay | Admitting: General Surgery

## 2018-01-27 NOTE — Progress Notes (Signed)
Inform patient of Pathology report,. Breast pathology shows benign papilloma. No cancer. No further surgery or treatment will be necessary. I will discuss in detail at next OV.  Let me know you reached her.  hmi

## 2019-11-05 ENCOUNTER — Ambulatory Visit: Payer: BC Managed Care – PPO | Attending: Family

## 2019-11-05 DIAGNOSIS — Z23 Encounter for immunization: Secondary | ICD-10-CM

## 2019-11-05 NOTE — Progress Notes (Signed)
   Covid-19 Vaccination Clinic  Name:  Ashley Gibson    MRN: [id number] DOB: 08-20-1971  11/05/2019  Ashley Gibson was observed post Covid-19 immunization for 15 minutes without incident. She was provided with Vaccine Information Sheet and instruction to access the V-Safe system.   Ashley Gibson was instructed to call 911 with any severe reactions post vaccine: Marland Kitchen Difficulty breathing  . Swelling of face and throat  . A fast heartbeat  . A bad rash all over body  . Dizziness and weakness   Immunizations Administered    Name Date Dose VIS Date Route   Moderna COVID-19 Vaccine 11/05/2019 12:02 PM 0.5 mL 07/28/2019 Intramuscular   Manufacturer: Moderna   Lot: [id number]   [id number]

## 2019-12-08 ENCOUNTER — Other Ambulatory Visit: Payer: Self-pay

## 2019-12-08 ENCOUNTER — Ambulatory Visit: Payer: BC Managed Care – PPO | Attending: Family

## 2019-12-08 DIAGNOSIS — Z23 Encounter for immunization: Secondary | ICD-10-CM

## 2019-12-08 NOTE — Progress Notes (Signed)
   Covid-19 Vaccination Clinic  Name:  Ashley Gibson    MRN: [id number] DOB: 01-03-1971  12/08/2019  [name] was observed post Covid-19 immunization for 15 minutes without incident. She was provided with Vaccine Information Sheet and instruction to access the V-Safe system.   [name] was instructed to call 911 with any severe reactions post vaccine: Marland Kitchen Difficulty breathing  . Swelling of face and throat  . A fast heartbeat  . A bad rash all over body  . Dizziness and weakness   Immunizations Administered    Name Date Dose VIS Date Route   Moderna COVID-19 Vaccine 12/08/2019  3:41 PM 0.5 mL 07/28/2019 Intramuscular   Manufacturer: Moderna   Lot: [id number]   [id number]

## 2019-12-18 ENCOUNTER — Other Ambulatory Visit: Payer: Self-pay

## 2019-12-18 ENCOUNTER — Emergency Department (HOSPITAL_COMMUNITY)
Admission: EM | Admit: 2019-12-18 | Discharge: 2019-12-18 | Disposition: A | Discharge status: Home / Self Care | Payer: BC Managed Care – PPO | Attending: Emergency Medicine | Admitting: Emergency Medicine

## 2019-12-18 ENCOUNTER — Emergency Department (HOSPITAL_COMMUNITY): Payer: BC Managed Care – PPO

## 2019-12-18 ENCOUNTER — Encounter (HOSPITAL_COMMUNITY): Payer: Self-pay | Admitting: Emergency Medicine

## 2019-12-18 DIAGNOSIS — S76912A Strain of unspecified muscles, fascia and tendons at thigh level, left thigh, initial encounter: Secondary | ICD-10-CM | POA: Diagnosis not present

## 2019-12-18 DIAGNOSIS — W109XXA Fall (on) (from) unspecified stairs and steps, initial encounter: Secondary | ICD-10-CM | POA: Diagnosis not present

## 2019-12-18 DIAGNOSIS — Y929 Unspecified place or not applicable: Secondary | ICD-10-CM | POA: Diagnosis not present

## 2019-12-18 DIAGNOSIS — S70922A Unspecified superficial injury of left thigh, initial encounter: Secondary | ICD-10-CM | POA: Diagnosis present

## 2019-12-18 DIAGNOSIS — Z793 Long term (current) use of hormonal contraceptives: Secondary | ICD-10-CM | POA: Insufficient documentation

## 2019-12-18 DIAGNOSIS — Z79899 Other long term (current) drug therapy: Secondary | ICD-10-CM | POA: Diagnosis not present

## 2019-12-18 DIAGNOSIS — W19XXXA Unspecified fall, initial encounter: Secondary | ICD-10-CM

## 2019-12-18 DIAGNOSIS — Y999 Unspecified external cause status: Secondary | ICD-10-CM | POA: Insufficient documentation

## 2019-12-18 DIAGNOSIS — Y939 Activity, unspecified: Secondary | ICD-10-CM | POA: Diagnosis not present

## 2019-12-18 LAB — I-STAT CHEM 8, ED
BUN: 20 mg/dL (ref 6–20)
Calcium, Ion: 1.25 mmol/L (ref 1.15–1.40)
Chloride: 107 mmol/L (ref 98–111)
Creatinine, Ser: 0.9 mg/dL (ref 0.44–1.00)
Glucose, Bld: 82 mg/dL (ref 70–99)
HCT: 41 % (ref 36.0–46.0)
Hemoglobin: 13.9 g/dL (ref 12.0–15.0)
Potassium: 4.5 mmol/L (ref 3.5–5.1)
Sodium: 143 mmol/L (ref 135–145)
TCO2: 26 mmol/L (ref 22–32)

## 2019-12-18 LAB — I-STAT BETA HCG BLOOD, ED (MC, WL, AP ONLY): I-stat hCG, quantitative: <5 m[IU]/mL (ref <5)

## 2019-12-18 LAB — CBG MONITORING, ED: Glucose-Capillary: 87 mg/dL (ref 70–99)

## 2019-12-18 MED ORDER — CYCLOBENZAPRINE HCL 10 MG PO TABS: 10.0000 mg | ORAL_TABLET | Freq: Two times a day (BID) | ORAL | 0 refills | Status: AC | PRN

## 2019-12-18 MED ORDER — IBUPROFEN 200 MG PO TABS
600.0000 mg | ORAL_TABLET | Freq: Once | ORAL | Status: AC
  Administered 2019-12-18: 600 mg via ORAL
  Filled 2019-12-18: qty 3

## 2019-12-18 NOTE — ED Notes (Signed)
Patient transported to XR. 

## 2019-12-18 NOTE — Discharge Instructions (Signed)
Please rest, ice, and elevate the leg for the next couple of days Wear ACE wrap to provide some compression Take Ibuprofen or Tylenol for pain Take Flexeril as needed for muscle pain/spasms or tightness. This medicine can make you sleepy Follow up with your doctor

## 2019-12-18 NOTE — ED Notes (Signed)
Unsuccessful blood draw x2 

## 2019-12-18 NOTE — ED Notes (Signed)
Patient ambulatory to restroom with standby assistance.

## 2019-12-18 NOTE — ED Notes (Signed)
Failed attempt to collect labs   

## 2019-12-18 NOTE — ED Provider Notes (Addendum)
[facility] DEPT Provider Note   CSN: [id number] Arrival date & time: 12/18/19  1628     History Chief Complaint  Patient presents with  . Leg Pain    Ashley Gibson is a 49 y.o. female who presents with a fall and left leg pain.  She states that she was seated on a step and stood up and took 1 step down and then she fell forward.  She states that she is not sure why she fell.  She did not feel lightheaded or trip over anything.  She doesn't think she passed out although per EMS she had a witnessed syncopal episode. She states that her legs may have just gave out on her.  She fell with outstretched hands which broke her fall and she did not hit her head.  She states that she was unable to get up due to having left thigh and hip pain.  EMS was called and she was able to get up and walk to the ambulance.  She states that she does not have any pain when she is still but when she moves her left hip and upper leg she feels a pulling sensation.  She denies headache, chest pain, shortness of breath, abdominal pain, knee or ankle or foot pain.  She is not on blood thinners.  She does not take any prescribed medicines.  She states that she does not drink a lot of water so may be dehydrated.  HPI     Past Medical History:  Diagnosis Date  . AMA (advanced maternal age) multigravida 70+   . Anxiety   . Depression   . 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 Fibroid   . GERD (gastroesophageal reflux disease)   . Hx of chlamydia infection   . Hx of varicella   . No pertinent past medical history   . Papilloma of breast    right  . UTI (lower urinary tract infection)     Patient Active Problem List   Diagnosis Date Noted  . 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 BREAST LUMPECTOMY WITH RADIOACTIVE SEED LOCALIZATION Right 01/24/2018   Procedure: RIGHT BREAST LUMPECTOMY X2 WITH  RADIOACTIVE SEED LOCALIZATION X2;  Surgeon: Fanny Skates, MD;  Location: [facility];  Service: General;  Laterality: Right;  . COLONOSCOPY       OB History    Gravida  2   Para  2   Term  2   Preterm  0   AB  0   Living  2     SAB  0   TAB  0   Ectopic  0   Multiple  0   Live Births  2           Family History  Problem Relation Age of Onset  . Hypertension Mother   . Asthma Son     Social History   Tobacco Use  . Smoking status: Never Smoker  . Smokeless tobacco: Never Used  Substance Use Topics  . Alcohol use: Yes    Comment: social  . Drug use: No    Home Medications Prior to Admission medications   Medication Sig Start Date End Date Taking? Authorizing Provider  etonogestrel (NEXPLANON) 68 MG IMPL implant 1 each by Subdermal route once.    [provider]  FLUoxetine (PROZAC) 10 MG tablet Take 10 mg by mouth daily.  [provider]  HYDROcodone-acetaminophen (NORCO) 5-325 MG tablet Take 1-2 tablets by mouth every 6 (six) hours as needed for moderate pain or severe pain. 01/24/18   Fanny Skates, MD  omeprazole (PRILOSEC) 40 MG capsule Take 40 mg by mouth daily.    [provider]    Allergies    Patient has no known allergies.  Review of Systems   Review of Systems  Constitutional: Negative for fever.  Respiratory: Negative for shortness of breath.   Cardiovascular: Negative for chest pain.  Gastrointestinal: Negative for abdominal pain, diarrhea, nausea and vomiting.  Musculoskeletal: Positive for arthralgias and myalgias. Negative for back pain.  All other systems reviewed and are negative.   Physical Exam Updated Vital Signs BP (!) 165/85   Pulse 68   Temp 97.8 F (36.6 C) (Oral)   Resp 20   SpO2 100%   Physical Exam Vitals and nursing note reviewed.  Constitutional:      General: She is not in acute distress.    Appearance: She is well-developed. She is obese. She is not  ill-appearing.  HENT:     Head: Normocephalic and atraumatic.  Eyes:     General: No scleral icterus.       Right eye: No discharge.        Left eye: No discharge.     Conjunctiva/sclera: Conjunctivae normal.     Pupils: Pupils are equal, round, and reactive to light.  Cardiovascular:     Rate and Rhythm: Normal rate and regular rhythm.  Pulmonary:     Effort: Pulmonary effort is normal. No respiratory distress.     Breath sounds: Normal breath sounds.  Abdominal:     General: There is no distension.     Palpations: Abdomen is soft.     Tenderness: There is no abdominal tenderness.  Musculoskeletal:     Cervical back: Normal range of motion.     Comments: Left hip: Limited due to body habitus. No tenderness with palpation of the hip. Pain is elicited with ROM of the thigh. There is mild tenderness over the anterior/lateral thigh with associated mild swelling and warmth. No knee, lower leg, ankle or foot tenderness. 2+ DP pulse  Skin:    General: Skin is warm and dry.  Neurological:     Mental Status: She is alert and oriented to person, place, and time.  Psychiatric:        Behavior: Behavior normal.     ED Results / Procedures / Treatments   Labs (all labs ordered are listed, but only abnormal results are displayed) Labs Reviewed  CBG MONITORING, ED  I-STAT CHEM 8, ED  I-STAT BETA HCG BLOOD, ED ([facility], [facility], [facility] ONLY)    EKG None  Radiology No results found.  Procedures Procedures (including critical care time)  Medications Ordered in ED Medications - No data to display  ED Course  I have reviewed the triage vital signs and the nursing notes.  Pertinent labs & imaging results that were available during my care of the patient were reviewed by me and considered in my medical decision making (see chart for details).  49 year old female presents with possible syncopal episode, fall, left thigh pain.  She is hypertensive but otherwise vital signs are normal.  EKG  obtained is normal sinus rhythm.  Orthostatics were obtained which are negative.  Blood work is normal.  Blood sugar is reassuring.  There is no clear cause of possible syncopal episode. Most likely vasovagal or orthostatic since  it occurred after standing up. X-ray of the hip was obtained which is negative for any acute bony abnormality.  Symptoms are most consistent with a muscle strain.  Advised rest, ice, compression, elevation.  Advised alternating Tylenol and ibuprofen and will prescribe Flexeril for muscle spasms or stiffness.  Advised PCP follow-up if she is not improving in the next 1 to 2 weeks.  MDM Rules/Calculators/A&P                       Final Clinical Impression(s) / ED Diagnoses Final diagnoses:  Muscle strain of left thigh, initial encounter  Fall, initial encounter    Rx / DC Orders ED Discharge Orders    None       Recardo Evangelist, PA-C 12/18/19 2033    Recardo Evangelist, PA-C 12/18/19 2034    Varney Biles, MD 12/20/19 [phone]

## 2019-12-18 NOTE — ED Triage Notes (Addendum)
Per EMS, patient from work, reports near syncopal episode walking down stairs. Fell face first. C/o left leg pain. Denies head injury and LOC.   Patient denies dizziness.

## 2020-07-07 ENCOUNTER — Ambulatory Visit: Payer: BC Managed Care – PPO | Attending: Internal Medicine

## 2020-07-07 DIAGNOSIS — Z23 Encounter for immunization: Secondary | ICD-10-CM

## 2020-07-07 NOTE — Progress Notes (Signed)
   Covid-19 Vaccination Clinic  Name:  Ashley Gibson    MRN: [id number] DOB: 1970/11/23  07/07/2020  [name] was observed post Covid-19 immunization for 15 minutes without incident. She was provided with Vaccine Information Sheet and instruction to access the V-Safe system.   [name] was instructed to call 911 with any severe reactions post vaccine: Marland Kitchen Difficulty breathing  . Swelling of face and throat  . A fast heartbeat  . A bad rash all over body  . Dizziness and weakness

## 2023-06-26 ENCOUNTER — Ambulatory Visit: Payer: BC Managed Care – PPO | Admitting: Family Medicine

## 2023-06-26 ENCOUNTER — Encounter: Payer: Self-pay | Admitting: Family Medicine

## 2023-06-26 VITALS — BP 150/87 | HR 66 | Ht 63.0 in | Wt 241.0 lb

## 2023-06-26 DIAGNOSIS — Z23 Encounter for immunization: Secondary | ICD-10-CM | POA: Diagnosis not present

## 2023-06-26 DIAGNOSIS — R0683 Snoring: Secondary | ICD-10-CM | POA: Diagnosis not present

## 2023-06-26 DIAGNOSIS — R030 Elevated blood-pressure reading, without diagnosis of hypertension: Secondary | ICD-10-CM | POA: Insufficient documentation

## 2023-06-26 DIAGNOSIS — F411 Generalized anxiety disorder: Secondary | ICD-10-CM | POA: Insufficient documentation

## 2023-06-26 DIAGNOSIS — H6123 Impacted cerumen, bilateral: Secondary | ICD-10-CM | POA: Diagnosis not present

## 2023-06-26 NOTE — Assessment & Plan Note (Addendum)
Repeat blood pressure still elevated, will do two week nurse visit for BP. Pt says that she has never had blood pressure concerns in the past

## 2023-06-26 NOTE — Progress Notes (Signed)
New patient visit   Patient: Ashley Gibson   DOB: 1971-03-15   52 y.o. Female  MRN: [id number] Visit Date: 06/26/2023  Today's healthcare provider: Charlton Amor, DO   Chief Complaint  Patient presents with   New Patient (Initial Visit)    Patient here to est PCP - also c/o  difficulty staying asleep x 4 years. Wonders if related to menopausal symptoms.     SUBJECTIVE    Chief Complaint  Patient presents with   New Patient (Initial Visit)    Patient here to est PCP - also c/o  difficulty staying asleep x 4 years. Wonders if related to menopausal symptoms.    HPI HPI     New Patient (Initial Visit)    Additional comments: Patient here to est PCP - also c/o  difficulty staying asleep x 4 years. Wonders if related to menopausal symptoms.       Last edited by Elizabeth Palau, LPN on 46/27/0350  9:58 AM.      Pt presents to establish care.   GAD- doing well   Has trouble sleeping. Admits to snoring    Review of Systems  Constitutional:  Negative for activity change, fatigue and fever.  Respiratory:  Negative for cough and shortness of breath.   Cardiovascular:  Negative for chest pain.  Gastrointestinal:  Negative for abdominal pain.  Genitourinary:  Negative for difficulty urinating.       Current Meds  Medication Sig   etonogestrel (NEXPLANON) 68 MG IMPL implant 1 each by Subdermal route once.   FLUoxetine (PROZAC) 10 MG tablet Take 20 mg by mouth daily as needed (high level of anxiety).   gabapentin (NEURONTIN) 100 MG capsule Take 100 mg by mouth daily. Take one ( 100mg  ) capsule once daily   ibuprofen (ADVIL) 800 MG tablet Take 800 mg by mouth every 8 (eight) hours as needed.    OBJECTIVE    BP (!) 150/87   Pulse 66   Ht 5\' 3"  (1.6 m)   Wt 241 lb (109.3 kg)   SpO2 100%   BMI 42.69 kg/m   Physical Exam Vitals and nursing note reviewed.  Constitutional:      General: She is not in acute distress.    Appearance: Normal appearance.  HENT:      Head: Normocephalic and atraumatic.     Right Ear: External ear normal. There is impacted cerumen.     Left Ear: External ear normal. There is impacted cerumen.     Nose: Nose normal.  Eyes:     Conjunctiva/sclera: Conjunctivae normal.  Cardiovascular:     Rate and Rhythm: Normal rate and regular rhythm.  Pulmonary:     Effort: Pulmonary effort is normal.     Breath sounds: Normal breath sounds.  Neurological:     General: No focal deficit present.     Mental Status: She is alert and oriented to person, place, and time.  Psychiatric:        Mood and Affect: Mood normal.        Behavior: Behavior normal.        Thought Content: Thought content normal.        Judgment: Judgment normal.        ASSESSMENT & PLAN    Problem List Items Addressed This Visit       Nervous and Auditory   Bilateral impacted cerumen    B/l cerumen impaction - did a manual removal with currette  with light and able to appropriately visualize tympanic membrane        Other   GAD (generalized anxiety disorder) - Primary    On prozac 10mg  and doing great, doing well so we will continue - she will reach out if she needs refills      Snoring    Referral to sleep medicine, pt admits to snoring at night and not feeling well rested with sleeping - also admits to some apneic pauses at night and waking up gurggling  - blood pressure also elevated today so I am wondering if this is the cause       Relevant Orders   Ambulatory referral to Sleep Studies   Elevated blood pressure reading    Repeat blood pressure still elevated, will do two week nurse visit for BP. Pt says that she has never had blood pressure concerns in the past       Other Visit Diagnoses     Encounter for immunization       Relevant Orders   Flu vaccine trivalent PF, 6mos and older(Flulaval,Afluria,Fluarix,Fluzone) (Completed)       Return in about 2 weeks (around 07/10/2023) for nurse visit BP check.      No orders of the  defined types were placed in this encounter.   Orders Placed This Encounter  Procedures   Flu vaccine trivalent PF, 6mos and older(Flulaval,Afluria,Fluarix,Fluzone)   Ambulatory referral to Sleep Studies    Referral Priority:   Routine    Referral Type:   Consultation    Referral Reason:   Specialty Services Required    Requested Specialty:   Sleep Medicine    Number of Visits Requested:   1     Charlton Amor, DO  [facility] at [facility] [phone] (phone) [phone] (fax)  [facility]

## 2023-06-26 NOTE — Assessment & Plan Note (Signed)
On prozac 10mg  and doing great, doing well so we will continue - she will reach out if she needs refills

## 2023-06-26 NOTE — Assessment & Plan Note (Signed)
Referral to sleep medicine, pt admits to snoring at night and not feeling well rested with sleeping - also admits to some apneic pauses at night and waking up gurggling  - blood pressure also elevated today so I am wondering if this is the cause

## 2023-06-26 NOTE — Assessment & Plan Note (Signed)
B/l cerumen impaction - did a manual removal with currette with light and able to appropriately visualize tympanic membrane

## 2024-01-02 ENCOUNTER — Encounter: Payer: Self-pay | Admitting: Family Medicine
